# Patient Record
Sex: Female | Born: 1950 | Race: White | Hispanic: No | Smoking: Former smoker
Health system: Southern US, Community
[De-identification: ages and names within clinical notes are randomized; demographics above are authoritative.]

## PROBLEM LIST (undated history)

## (undated) DIAGNOSIS — K219 Gastro-esophageal reflux disease without esophagitis: Secondary | ICD-10-CM

## (undated) DIAGNOSIS — M1990 Unspecified osteoarthritis, unspecified site: Secondary | ICD-10-CM

## (undated) DIAGNOSIS — L989 Disorder of the skin and subcutaneous tissue, unspecified: Secondary | ICD-10-CM

## (undated) DIAGNOSIS — T7840XA Allergy, unspecified, initial encounter: Secondary | ICD-10-CM

## (undated) DIAGNOSIS — K2270 Barrett's esophagus without dysplasia: Secondary | ICD-10-CM

## (undated) DIAGNOSIS — J309 Allergic rhinitis, unspecified: Secondary | ICD-10-CM

## (undated) DIAGNOSIS — H269 Unspecified cataract: Secondary | ICD-10-CM

## (undated) DIAGNOSIS — D649 Anemia, unspecified: Secondary | ICD-10-CM

## (undated) DIAGNOSIS — R011 Cardiac murmur, unspecified: Secondary | ICD-10-CM

## (undated) DIAGNOSIS — M810 Age-related osteoporosis without current pathological fracture: Secondary | ICD-10-CM

## (undated) DIAGNOSIS — I10 Essential (primary) hypertension: Secondary | ICD-10-CM

## (undated) DIAGNOSIS — E785 Hyperlipidemia, unspecified: Secondary | ICD-10-CM

## (undated) DIAGNOSIS — I341 Nonrheumatic mitral (valve) prolapse: Secondary | ICD-10-CM

## (undated) DIAGNOSIS — F419 Anxiety disorder, unspecified: Secondary | ICD-10-CM

## (undated) HISTORY — DX: Nonrheumatic mitral (valve) prolapse: I34.1

## (undated) HISTORY — DX: Disorder of the skin and subcutaneous tissue, unspecified: L98.9

## (undated) HISTORY — DX: Anemia, unspecified: D64.9

## (undated) HISTORY — DX: Allergy, unspecified, initial encounter: T78.40XA

## (undated) HISTORY — DX: Hyperlipidemia, unspecified: E78.5

## (undated) HISTORY — DX: Cardiac murmur, unspecified: R01.1

## (undated) HISTORY — DX: Allergic rhinitis, unspecified: J30.9

## (undated) HISTORY — DX: Anxiety disorder, unspecified: F41.9

## (undated) HISTORY — DX: Unspecified osteoarthritis, unspecified site: M19.90

## (undated) HISTORY — DX: Gastro-esophageal reflux disease without esophagitis: K21.9

## (undated) HISTORY — DX: Age-related osteoporosis without current pathological fracture: M81.0

## (undated) HISTORY — DX: Unspecified cataract: H26.9

## (undated) HISTORY — DX: Barrett's esophagus without dysplasia: K22.70

## (undated) HISTORY — PX: COSMETIC SURGERY: SHX468

---

## 1973-04-19 HISTORY — PX: BARTHOLIN GLAND CYST EXCISION: SHX565

## 1973-04-19 HISTORY — PX: BREAST ENHANCEMENT SURGERY: SHX7

## 1985-04-19 HISTORY — PX: LAPAROSCOPIC OOPHERECTOMY: SHX6507

## 1985-04-19 HISTORY — PX: TUBAL LIGATION: SHX77

## 1987-04-20 HISTORY — PX: TRANSANAL EXCISION OF RECTAL MASS WITH HEMORRHOID INJECTION: SHX6135

## 2009-04-19 HISTORY — PX: LAPAROSCOPIC CHOLECYSTECTOMY: SUR755

## 2010-04-22 LAB — HM COLONOSCOPY

## 2014-10-31 LAB — HM MAMMOGRAPHY

## 2014-10-31 LAB — HM DEXA SCAN

## 2015-04-20 DIAGNOSIS — K2270 Barrett's esophagus without dysplasia: Secondary | ICD-10-CM

## 2015-04-20 HISTORY — DX: Barrett's esophagus without dysplasia: K22.70

## 2015-09-05 LAB — HM COLONOSCOPY

## 2016-06-13 ENCOUNTER — Emergency Department
Admission: EM | Admit: 2016-06-13 | Discharge: 2016-06-13 | Disposition: A | Discharge status: Home / Self Care | Payer: Medicare Other | Attending: Emergency Medicine | Admitting: Emergency Medicine

## 2016-06-13 DIAGNOSIS — R55 Syncope and collapse: Secondary | ICD-10-CM | POA: Insufficient documentation

## 2016-06-13 DIAGNOSIS — I10 Essential (primary) hypertension: Secondary | ICD-10-CM | POA: Insufficient documentation

## 2016-06-13 HISTORY — DX: Essential (primary) hypertension: I10

## 2016-06-13 LAB — GLUCOSE, CAPILLARY: Glucose-Capillary: 134 mg/dL — ABNORMAL HIGH (ref 65–99)

## 2016-06-13 NOTE — ED Triage Notes (Signed)
Pt escorted down from L&D , states she was with her son who just had a baby then moved to the other room after delivery the pt became hot and sat down at that time had LOC.. Pt states she did not eat this morning and is A/Ox3..Marland Kitchen

## 2016-06-13 NOTE — ED Provider Notes (Signed)
[facility] Emergency Department Provider Note  ____________________________________________   First MD Initiated Contact with Patient 06/13/16 [phone]     (approximate)  I have reviewed the triage vital signs and the nursing notes.   HISTORY  Chief Complaint Loss of Consciousness   HPI Kara French is a 66 y.o. female with a history of hypertension who is presenting after a near-syncopal episode. She says that she was up in labor and delivery witnessing the birth of her first grandchild. She says that she had just returned from [place] yesterday. She said that she had only gotten about 2 hours of sleep. Had not eaten anything yesterday and just had a doughnut this morning. Said that she had not been drinking as she normally would as well. Says that she also has IBS and has been having diarrhea because this is what happens to her when she "gets excited or anxious about something." She said that she had been standing up in a hot room upstairs in labor and delivery. She said that she began to feel lightheaded and nauseous and had several episodes of dry heaving. She was able to be moved to a chair where she said that she nearly lost consciousness. She said that there was a graying out of her vision that felt like things were closing in on her. She denies any palpitations, chest pain or shortness of breath. She says that after the incident she is now feeling much improved after being in a cool emergency room room. She denies any history of heart attack. No history of stroke. Denies any focal weakness or numbness. Son denies any seizure activity during the incident and said she was nearly unconscious for about 5 seconds.   Past Medical History:  Diagnosis Date  . Hypertension     There are no active problems to display for this patient.   History reviewed. No pertinent surgical history.  Prior to Admission medications   Not on File    Allergies Contrast media  [iodinated diagnostic agents]  No family history on file.  Social History Social History  Substance Use Topics  . Smoking status: Never Smoker  . Smokeless tobacco: Never Used  . Alcohol use No    Review of Systems Constitutional: No fever/chills Eyes: No visual changes. ENT: No sore throat. Cardiovascular: Denies chest pain. Respiratory: Denies shortness of breath. Gastrointestinal: No abdominal pain. no vomiting.  No diarrhea.  No constipation. Genitourinary: Negative for dysuria. Musculoskeletal: Negative for back pain. Skin: Negative for rash. Neurological: Negative for headaches, focal weakness or numbness.  10-point ROS otherwise negative.  ____________________________________________   PHYSICAL EXAM:  VITAL SIGNS: ED Triage Vitals  Enc Vitals Group     BP 06/13/16 0826 139/64     Pulse Rate 06/13/16 0826 73     Resp 06/13/16 0826 16     Temp 06/13/16 0826 97.8 F (36.6 C)     Temp Source 06/13/16 0826 Oral     SpO2 06/13/16 0826 98 %     Weight 06/13/16 0822 175 lb (79.4 kg)     Height 06/13/16 0822 5\' 8"  (1.727 m)     Head Circumference --      Peak Flow --      Pain Score --      Pain Loc --      Pain Edu? --      Excl. in GC? --     Constitutional: Alert and oriented. Well appearing and in no acute distress. Eyes:  Conjunctivae are normal. PERRL. EOMI. Head: Atraumatic. Nose: No congestion/rhinnorhea. Mouth/Throat: Mucous membranes are moist.   Neck: No stridor.   Cardiovascular: Normal rate, regular rhythm. Grossly normal heart sounds.  Respiratory: Normal respiratory effort.  No retractions. Lungs CTAB. Gastrointestinal: Soft and nontender. No distention. Musculoskeletal: No lower extremity tenderness nor edema.  No joint effusions. Neurologic:  Normal speech and language. No gross focal neurologic deficits are appreciated.  Skin:  Skin is warm, dry and intact. No rash noted. Psychiatric: Mood and affect are normal. Speech and behavior are  normal.  ____________________________________________   LABS (all labs ordered are listed, but only abnormal results are displayed)  Labs Reviewed  GLUCOSE, CAPILLARY - Abnormal; Notable for the following:       Result Value   Glucose-Capillary 134 (*)    All other components within normal limits   ____________________________________________  EKG  ED ECG REPORT I, Schaevitz,  Teena Iraniavid M, the attending physician, personally viewed and interpreted this ECG.   Date: 06/13/2016  EKG Time: 0833  Rate: 68  Rhythm: normal sinus rhythm  Axis: Normal axis  Intervals:none  ST&T Change: No ST segment elevation or depression. No abnormal T-wave inversion.  ____________________________________________  RADIOLOGY   ____________________________________________   PROCEDURES  Procedure(s) performed:   Procedures  Critical Care performed:   ____________________________________________   INITIAL IMPRESSION / ASSESSMENT AND PLAN / ED COURSE  Pertinent labs & imaging results that were available during my care of the patient were reviewed by me and considered in my medical decision making (see chart for details).  Patient with multiple factors contributing to what appears to be vasovagal syncope. Reassuring EKG and very reassuring story for this condition. I do not see in [place] to work her up for ACS at this time. Very unlikely that this would've been a pathologic arrhythmia causing her near syncopal episode. I advised her to get rest today as well as he and drink normally. She is understanding of this plan and willing to comply. Will be discharged.      ____________________________________________   FINAL CLINICAL IMPRESSION(S) / ED DIAGNOSES  Near-syncope.    NEW MEDICATIONS STARTED DURING THIS VISIT:  New Prescriptions   No medications on file     Note:  This document was prepared using Dragon voice recognition software and may include unintentional dictation  errors.    Myrna Blazeravid Matthew Schaevitz, MD 06/13/16 [phone]

## 2016-07-21 LAB — CBC AND DIFFERENTIAL
HCT: 38 (ref 36–46)
HEMOGLOBIN: 12.7 (ref 12.0–16.0)
NEUTROS ABS: 3
WBC: 5.2

## 2016-07-21 LAB — BASIC METABOLIC PANEL
BUN: 17 (ref 4–21)
Creatinine: 0.8 (ref 0.5–1.1)
Glucose: 100
POTASSIUM: 3.7 (ref 3.4–5.3)
Sodium: 141 (ref 137–147)

## 2016-07-21 LAB — LIPID PANEL
CHOLESTEROL: 179 (ref 0–200)
HDL: 44 (ref 35–70)
LDL Cholesterol: 116
TRIGLYCERIDES: 97 (ref 40–160)

## 2016-07-21 LAB — HEPATIC FUNCTION PANEL
ALT: 15 (ref 7–35)
AST: 15 (ref 13–35)
Alkaline Phosphatase: 52 (ref 25–125)

## 2016-07-21 LAB — TSH: TSH: 0.95 (ref 0.41–5.90)

## 2016-07-21 LAB — HEMOGLOBIN A1C: Hemoglobin A1C: 5.3

## 2017-01-13 ENCOUNTER — Ambulatory Visit (INDEPENDENT_AMBULATORY_CARE_PROVIDER_SITE_OTHER): Payer: Medicare Other | Admitting: Family Medicine

## 2017-01-13 ENCOUNTER — Encounter: Payer: Self-pay | Admitting: Family Medicine

## 2017-01-13 VITALS — BP 154/82 | HR 68 | Temp 97.6°F | Resp 16 | Ht 67.0 in | Wt 187.0 lb

## 2017-01-13 DIAGNOSIS — Z23 Encounter for immunization: Secondary | ICD-10-CM | POA: Diagnosis not present

## 2017-01-13 DIAGNOSIS — I10 Essential (primary) hypertension: Secondary | ICD-10-CM | POA: Diagnosis not present

## 2017-01-13 DIAGNOSIS — I341 Nonrheumatic mitral (valve) prolapse: Secondary | ICD-10-CM | POA: Insufficient documentation

## 2017-01-13 DIAGNOSIS — K2100 Gastro-esophageal reflux disease with esophagitis, without bleeding: Secondary | ICD-10-CM

## 2017-01-13 DIAGNOSIS — Z1283 Encounter for screening for malignant neoplasm of skin: Secondary | ICD-10-CM

## 2017-01-13 DIAGNOSIS — J3089 Other allergic rhinitis: Secondary | ICD-10-CM

## 2017-01-13 DIAGNOSIS — E785 Hyperlipidemia, unspecified: Secondary | ICD-10-CM | POA: Insufficient documentation

## 2017-01-13 DIAGNOSIS — K219 Gastro-esophageal reflux disease without esophagitis: Secondary | ICD-10-CM | POA: Insufficient documentation

## 2017-01-13 DIAGNOSIS — Z1159 Encounter for screening for other viral diseases: Secondary | ICD-10-CM

## 2017-01-13 DIAGNOSIS — J309 Allergic rhinitis, unspecified: Secondary | ICD-10-CM | POA: Insufficient documentation

## 2017-01-13 DIAGNOSIS — Z7689 Persons encountering health services in other specified circumstances: Secondary | ICD-10-CM

## 2017-01-13 DIAGNOSIS — K22719 Barrett's esophagus with dysplasia, unspecified: Secondary | ICD-10-CM

## 2017-01-13 NOTE — Patient Instructions (Signed)

## 2017-01-13 NOTE — Assessment & Plan Note (Addendum)
Patient brings recent lipid panel Continue pravastatin 10 mg daily Continue baby aspirin Repeat lipid panel at CPE in 6 months

## 2017-01-13 NOTE — Progress Notes (Signed)
Patient: Kara French Female    DOB: 1951/03/06   66 y.o.   MRN: [id number] Visit Date: 01/13/2017  Today's Provider: Shirlee Latch, MD   Chief Complaint  Patient presents with  . Establish Care   Subjective:    HPI   Kara French presents to establish care. She recently moved to the area from [place], [place]. She moved to be closer to her son, who is expecting his first child. She has a H/O HTN, mitral valve prolapse, hyperlipidemia, GERD, and seasonal allergies. She states she has white coat syndrome. She reports she is UTD on PNA vaccines, and is requesting a flu vaccine today.  Sees dermatologist annually for skin check - lost her husband in his 93s to melonoma. Would like referral. No personal or family h/o skin cancer  Sees cardiologist annually for mitral valve prolapse. Denies CP, SOB, DOE, LE edema.  HTN: taking Valsartan-HCTZ, Toprol daily.  Usually well controlled at home.  Does elevate in offices.  No medication side effects.  HLD: Taking pravastatin and ASA  daily.  No medication side effects.  Barretts esophagus, GERD: last EGD in 09/2015, recommended repeat in 3 years.  Taking Protonix  daily.  Trying to avoid acidic food to help with symptoms  Allergic rhinitis: Taking Zyrtec daily, seems to help, worse in the fall and spring.    H/o leukopenia: Had full work up from hematologist.  No cause found.  H/o hematuria: full workup by urologist.  Allergies  Allergen Reactions  . Neomycin-Bacitracin Zn-Polymyx   . Tape   . Contrast Media [Iodinated Diagnostic Agents] Rash  . Decongestant [Pseudoephedrine Hcl Er] Itching     Current Outpatient Prescriptions:  .  aspirin 81 MG chewable tablet, Chew 1 tablet by mouth daily., Disp: , Rfl:  .  cetirizine (ZYRTEC) 10 MG tablet, Take 10 mg by mouth daily., Disp: , Rfl:  .  Olopatadine HCl (PATADAY) 0.2 % SOLN, Apply to eye., Disp: , Rfl:  .  pantoprazole (PROTONIX) 40 MG tablet, Take 1 tablet by  mouth daily., Disp: , Rfl:  .  pravastatin (PRAVACHOL) 10 MG tablet, Take 1 tablet by mouth daily., Disp: , Rfl:  .  TOPROL XL 100 MG 24 hr tablet, Take 1 tablet by mouth daily., Disp: , Rfl:  .  valsartan-hydrochlorothiazide (DIOVAN-HCT) 320-25 MG tablet, Take 1 tablet by mouth daily., Disp: , Rfl:   Review of Systems  Constitutional: Negative.   HENT: Positive for postnasal drip, rhinorrhea and sneezing. Negative for congestion, dental problem, drooling, ear discharge, ear pain, facial swelling, hearing loss, mouth sores, nosebleeds, sinus pain, sinus pressure, sore throat, tinnitus, trouble swallowing and voice change.   Eyes: Positive for photophobia, pain, discharge, redness and itching. Negative for visual disturbance.  Respiratory: Negative.   Cardiovascular: Negative.   Gastrointestinal: Positive for abdominal distention. Negative for abdominal pain, anal bleeding, blood in stool, constipation, diarrhea, nausea, rectal pain and vomiting.  Endocrine: Negative.   Genitourinary: Negative.   Musculoskeletal: Negative.   Skin: Negative.   Allergic/Immunologic: Positive for environmental allergies. Negative for food allergies and immunocompromised state.  Neurological: Negative.   Hematological: Negative.   Psychiatric/Behavioral: Negative.    Past Medical History:  Diagnosis Date  . Allergic rhinitis   . Barrett's esophagus 2017   biopsy confirmed, related to GERD  . GERD (gastroesophageal reflux disease)   . Hyperlipidemia   . Hypertension   . Mitral valve prolapse    followed by cardiology   Past Surgical  History:  Procedure Laterality Date  . BARTHOLIN GLAND CYST EXCISION  1975  . BREAST ENHANCEMENT SURGERY Bilateral 1975  . LAPAROSCOPIC CHOLECYSTECTOMY  2011  . LAPAROSCOPIC OOPHERECTOMY Right 1987   2/2 benign tumor  . TRANSANAL EXCISION OF RECTAL MASS WITH HEMORRHOID INJECTION  1989   hemorrhoid removal  . TUBAL LIGATION  1987   Family History  Problem Relation Age  of Onset  . Dementia Mother 30  . Heart disease Mother 70       s/p 7 stents and CABG x3  . Colon cancer Sister 84  . Heart disease Brother        s./p stent placement  . Heart disease Brother        s/p stent placement  . Congestive Heart Failure Maternal Grandmother 82  . Heart attack Maternal Grandfather 66  . Healthy Paternal Grandmother   . Breast cancer Paternal Aunt 42    Social History  Substance Use Topics  . Smoking status: Former Smoker    Packs/day: 0.50    Years: 15.00    Types: Cigarettes    Quit date: 04/18/1997  . Smokeless tobacco: Never Used  . Alcohol use No   Objective:   BP (!) 154/82 (BP Location: Left Arm, Patient Position: Sitting, Cuff Size: Large) Comment: white coat syndrome  Pulse 68   Temp 97.6 F (36.4 C) (Oral)   Resp 16   Ht  (1.702 m)   Wt 187 lb (84.8 kg)   BMI 29.29 kg/m  Vitals:   01/13/17 1415  BP: (!) 154/82  Pulse: 68  Resp: 16  Temp: 97.6 F (36.4 C)  TempSrc: Oral  Weight: 187 lb (84.8 kg)  Height:  (1.702 m)     Physical Exam  Constitutional: She is oriented to person, place, and time. She appears well-developed and well-nourished. No distress.  HENT:  Head: Normocephalic and atraumatic.  Right Ear: External ear normal.  Left Ear: External ear normal.  Nose: Nose normal.  Mouth/Throat: Oropharynx is clear and moist.  Eyes: Pupils are equal, round, and reactive to light. Conjunctivae are normal. No scleral icterus.  Neck: Neck supple. No thyromegaly present.  Cardiovascular: Normal rate, regular rhythm, normal heart sounds and intact distal pulses.   No murmur heard. Pulmonary/Chest: Effort normal and breath sounds normal. No respiratory distress. She has no wheezes. She has no rales.  Abdominal: Soft. Bowel sounds are normal. She exhibits no distension. There is no tenderness. There is no rebound and no guarding.  Musculoskeletal: She exhibits no edema or deformity.  Lymphadenopathy:    She has no  cervical adenopathy.  Neurological: She is alert and oriented to person, place, and time.  Skin: Skin is warm and dry. No rash noted.  Psychiatric: She has a normal mood and affect. Her behavior is normal.  Vitals reviewed.      Assessment & Plan:      Problem List Items Addressed This Visit      Cardiovascular and Mediastinum   Hypertension    Uncontrolled today, patient reports is well controlled at home and she has white coat syndrome Continue valsartan HCTZ and Toprol-XL Recheck BMP Follow-up in 6 months      Relevant Medications   aspirin 81 MG chewable tablet   TOPROL XL 100 MG 24 hr tablet   pravastatin (PRAVACHOL) 10 MG tablet   valsartan-hydrochlorothiazide (DIOVAN-HCT) 320-25 MG tablet   Other Relevant Orders   BASIC METABOLIC PANEL WITH GFR   Mitral valve  prolapse    Will need cardiology referral at next CPE No murmur audible while sitting today      Relevant Medications   aspirin 81 MG chewable tablet   TOPROL XL 100 MG 24 hr tablet   pravastatin (PRAVACHOL) 10 MG tablet   valsartan-hydrochlorothiazide (DIOVAN-HCT) 320-25 MG tablet     Respiratory   Allergic rhinitis    Well-controlled Continue Zyrtec, Pataday eyedrops        Digestive   Barrett's esophagus    Biopsy confirmed on EGD in 2017 per records that the patient has brought with her Continue PPI Will need repeat GI referral for EGD in 2020      GERD (gastroesophageal reflux disease)    Fair control, but diet dependent Patient is going to try to eat more bland foods Continue PPI Given Barrett's esophagus, she will need repeat EGD in 2020      Relevant Medications   pantoprazole (PROTONIX) 40 MG tablet     Other   Hyperlipidemia    Patient brings recent lipid panel Continue pravastatin 10 mg daily Continue baby aspirin Repeat lipid panel at CPE in 6 months      Relevant Medications   aspirin 81 MG chewable tablet   TOPROL XL 100 MG 24 hr tablet   pravastatin (PRAVACHOL) 10 MG  tablet   valsartan-hydrochlorothiazide (DIOVAN-HCT) 320-25 MG tablet    Other Visit Diagnoses    Encounter to establish care    -  Primary   Screening exam for skin cancer       Relevant Orders   Ambulatory referral to Dermatology   Need for hepatitis C screening test       Relevant Orders   Hepatitis C Antibody   Encounter for immunization       Relevant Orders   Flu vaccine HIGH DOSE PF (Completed)     We'll request records from PCP     The entirety of the information documented in the History of Present Illness, Review of Systems and Physical Exam were personally obtained by me. Portions of this information were initially documented by Irving Burton Ratchford, CMA and reviewed by me for thoroughness and accuracy.    Shirlee Latch, MD  [facility]

## 2017-01-13 NOTE — Assessment & Plan Note (Signed)
Biopsy confirmed on EGD in 2017 per records that the patient has brought with her Continue PPI Will need repeat GI referral for EGD in 2020

## 2017-01-13 NOTE — Assessment & Plan Note (Signed)
Well-controlled Continue Zyrtec, Pataday eyedrops

## 2017-01-13 NOTE — Assessment & Plan Note (Signed)
Fair control, but diet dependent Patient is going to try to eat more bland foods Continue PPI Given Barrett's esophagus, she will need repeat EGD in 2020

## 2017-01-13 NOTE — Assessment & Plan Note (Signed)
Will need cardiology referral at next CPE No murmur audible while sitting today

## 2017-01-13 NOTE — Assessment & Plan Note (Signed)
Uncontrolled today, patient reports is well controlled at home and she has white coat syndrome Continue valsartan HCTZ and Toprol-XL Recheck BMP Follow-up in 6 months

## 2017-01-14 ENCOUNTER — Encounter: Payer: Self-pay | Admitting: Family Medicine

## 2017-01-14 ENCOUNTER — Telehealth: Payer: Self-pay

## 2017-01-14 LAB — BASIC METABOLIC PANEL WITH GFR
BUN: 14 mg/dL (ref 7–25)
CO2: 29 mmol/L (ref 20–32)
CREATININE: 0.79 mg/dL (ref 0.50–0.99)
Calcium: 9.3 mg/dL (ref 8.6–10.4)
Chloride: 102 mmol/L (ref 98–110)
GFR, EST AFRICAN AMERICAN: 89 mL/min/{1.73_m2} (ref >=60)
GFR, EST NON AFRICAN AMERICAN: 77 mL/min/{1.73_m2} (ref >=60)
Glucose, Bld: 87 mg/dL (ref 65–99)
POTASSIUM: 3.7 mmol/L (ref 3.5–5.3)
SODIUM: 141 mmol/L (ref 135–146)

## 2017-01-14 LAB — HEPATITIS C ANTIBODY
Hepatitis C Ab: NONREACTIVE
SIGNAL TO CUT-OFF: 0.01 (ref <1.00)

## 2017-01-14 NOTE — Telephone Encounter (Signed)
-----   Message from Erasmo Downer, MD sent at 01/14/2017 12:22 PM EDT ----- Normal kidney function and electrolytes. Negative hepatitis C screening.  Erasmo Downer, MD, MPH [facility] 01/14/2017 12:21 PM

## 2017-01-14 NOTE — Telephone Encounter (Signed)
Pt advised.

## 2017-01-17 ENCOUNTER — Encounter: Payer: Self-pay | Admitting: Family Medicine

## 2017-02-10 ENCOUNTER — Ambulatory Visit: Payer: Self-pay | Admitting: Family Medicine

## 2017-04-13 ENCOUNTER — Ambulatory Visit (INDEPENDENT_AMBULATORY_CARE_PROVIDER_SITE_OTHER): Payer: Medicare Other | Admitting: Physician Assistant

## 2017-04-13 ENCOUNTER — Encounter: Payer: Self-pay | Admitting: Physician Assistant

## 2017-04-13 VITALS — BP 144/80 | HR 64 | Temp 98.2°F | Resp 16 | Wt 188.0 lb

## 2017-04-13 DIAGNOSIS — R300 Dysuria: Secondary | ICD-10-CM | POA: Diagnosis not present

## 2017-04-13 DIAGNOSIS — I10 Essential (primary) hypertension: Secondary | ICD-10-CM

## 2017-04-13 DIAGNOSIS — N3090 Cystitis, unspecified without hematuria: Secondary | ICD-10-CM | POA: Diagnosis not present

## 2017-04-13 MED ORDER — SULFAMETHOXAZOLE-TRIMETHOPRIM 800-160 MG PO TABS: 1.0000 | ORAL_TABLET | Freq: Two times a day (BID) | ORAL | 0 refills | Status: AC

## 2017-04-13 NOTE — Patient Instructions (Signed)

## 2017-04-13 NOTE — Progress Notes (Signed)
[facility]  Chief Complaint  Patient presents with  . Urinary Tract Infection    Started two days ago.    Subjective:    Patient ID: Kara HampshireWilma Dean Ytuarte, female    DOB: 09-Nov-1950, 66 y.o.   MRN: [id number]   Urinary Tract Infection: Patient complains of frequency, nausea and urgency She has had symptoms for 3 days. Patient also complains of Lower abdominal pain. Patient denies back pain and vaginal discharge. Denies N/V. Patient does have a history of recurrent UTI.  Patient does not have a history of pyelonephritis or other renal issues. Patient denies vaginal discharge and denies new sexual partners. The patient denies recent travel outside of the [place]. She has used AZO tablets with moderate relief. She has had Bactrim for a UTI before with good tolerance and resolution.  She is also asking about her BP medication in regards to the valsartan recall. She is wondering if it should be changed.  Review of Systems  Constitutional: Positive for malaise/fatigue. Negative for chills, diaphoresis, fever and weight loss.  Gastrointestinal: Positive for nausea. Negative for abdominal pain, blood in stool, constipation, diarrhea, heartburn, melena and vomiting.  Genitourinary: Positive for dysuria, frequency and urgency. Negative for flank pain and hematuria.  Neurological: Negative for dizziness, weakness and headaches.       Objective:   BP (!) 144/80 (BP Location: Right Arm, Patient Position: Sitting, Cuff Size: Normal)   Pulse 64   Temp 98.2 F (36.8 C) (Oral)   Resp 16   Wt 188 lb (85.3 kg)   BMI 29.44 kg/m   Patient Active Problem List   Diagnosis Date Noted  . Hypertension   . Hyperlipidemia   . Allergic rhinitis   . GERD (gastroesophageal reflux disease)   . Mitral valve prolapse   . Barrett's esophagus 04/20/2015    Outpatient Encounter Medications as of 04/13/2017  Medication Sig  . aspirin 81 MG chewable tablet Chew 1  tablet by mouth daily.  . cetirizine (ZYRTEC) 10 MG tablet Take 10 mg by mouth daily.  . Olopatadine HCl (PATADAY) 0.2 % SOLN Apply to eye.  . pantoprazole (PROTONIX) 40 MG tablet Take 1 tablet by mouth daily.  . pravastatin (PRAVACHOL) 10 MG tablet Take 1 tablet by mouth daily.  . TOPROL XL 100 MG 24 hr tablet Take 1 tablet by mouth daily.  . valsartan-hydrochlorothiazide (DIOVAN-HCT) 320-25 MG tablet Take 1 tablet by mouth daily.   No facility-administered encounter medications on file as of 04/13/2017.     Allergies  Allergen Reactions  . Neomycin-Bacitracin Zn-Polymyx   . Tape   . Contrast Media [Iodinated Diagnostic Agents] Rash  . Decongestant [Pseudoephedrine Hcl Er] Itching       Physical Exam  Constitutional: She is oriented to person, place, and time. She appears well-developed and well-nourished. No distress.  Cardiovascular: Normal rate and regular rhythm.  Pulmonary/Chest: Effort normal and breath sounds normal.  Abdominal: Soft. Bowel sounds are normal. She exhibits no distension. There is tenderness in the suprapubic area. There is no rebound, no guarding and no CVA tenderness.  Neurological: She is alert and oriented to person, place, and time.  Skin: Skin is warm and dry. She is not diaphoretic.  Psychiatric: She has a normal mood and affect. Her behavior is normal.       Assessment & Plan:  1. Cystitis  Dipstick positive for some blood and leukocytes. Will cx and begin empiric treatment with bactrim.  - POCT urinalysis  dipstick - CULTURE, URINE COMPREHENSIVE  2. Dysuria  - sulfamethoxazole-trimethoprim (BACTRIM DS) 800-160 MG tablet; Take 1 tablet by mouth 2 (two) times daily for 3 days.  Dispense: 6 tablet; Refill: 0 - CULTURE, URINE COMPREHENSIVE  3. Essential Hypertension  She is currently on Valsartan-HCTZ. I will defer to Dr. B for them to decide on a mutually acceptable alternative if necessary.  Return if symptoms worsen or fail to  improve.  The entirety of the information documented in the History of Present Illness, Review of Systems and Physical Exam were personally obtained by me. Portions of this information were initially documented by Kavin LeechLaura Walsh, CMA and reviewed by me for thoroughness and accuracy.

## 2017-04-14 ENCOUNTER — Telehealth: Payer: Self-pay | Admitting: Family Medicine

## 2017-04-14 MED ORDER — LOSARTAN POTASSIUM-HCTZ 100-25 MG PO TABS: 1.0000 | ORAL_TABLET | Freq: Every day | ORAL | 3 refills | Status: DC

## 2017-04-14 NOTE — Telephone Encounter (Signed)
Left message advising pt. 

## 2017-04-14 NOTE — Telephone Encounter (Signed)
Patient asked Ricki Rodriguezdriana in recent visit about Valsartan recall.  Seems she was not notified by pharmacy that her brand of Valsartan was recalled, but she did recently move.  We can go ahead and switch to losartan to be on the safe side.  Will Rx losartan-hctz 100-25mg  daily.  Stop valsartan-hctz.  Please let patient know.  We will f/u on BP at next appt.  Erasmo DownerBacigalupo, Tameisha Covell M, MD, MPH [facility] 04/14/2017 2:44 PM

## 2017-04-15 LAB — POCT URINALYSIS DIPSTICK
Bilirubin, UA: NEGATIVE
Glucose, UA: NEGATIVE
Ketones, UA: NEGATIVE
Leukocytes, UA: NEGATIVE
Nitrite, UA: NEGATIVE
Protein, UA: NEGATIVE
Spec Grav, UA: 1.02 (ref 1.010–1.025)
Urobilinogen, UA: 0.2 E.U./dL
pH, UA: 5 (ref 5.0–8.0)

## 2017-04-15 LAB — CULTURE, URINE COMPREHENSIVE
MICRO NUMBER:: 81449425
SPECIMEN QUALITY:: ADEQUATE

## 2017-04-27 ENCOUNTER — Encounter: Payer: Self-pay | Admitting: Family Medicine

## 2017-04-27 ENCOUNTER — Ambulatory Visit (INDEPENDENT_AMBULATORY_CARE_PROVIDER_SITE_OTHER): Payer: Medicare Other | Admitting: Family Medicine

## 2017-04-27 VITALS — BP 142/84 | HR 64 | Temp 97.9°F | Resp 16 | Wt 193.0 lb

## 2017-04-27 DIAGNOSIS — M62838 Other muscle spasm: Secondary | ICD-10-CM | POA: Diagnosis not present

## 2017-04-27 DIAGNOSIS — S39012A Strain of muscle, fascia and tendon of lower back, initial encounter: Secondary | ICD-10-CM | POA: Diagnosis not present

## 2017-04-27 MED ORDER — BACLOFEN 10 MG PO TABS: 10.0000 mg | ORAL_TABLET | Freq: Three times a day (TID) | ORAL | 0 refills | Status: DC

## 2017-04-27 MED ORDER — CYCLOBENZAPRINE HCL 5 MG PO TABS: 5.0000 mg | ORAL_TABLET | Freq: Three times a day (TID) | ORAL | 1 refills | Status: DC | PRN

## 2017-04-27 NOTE — Progress Notes (Addendum)
Patient: Kara HampshireWilma Dean Anger Female    DOB: 04-23-1950   67 y.o.   MRN: [id number] Visit Date: 04/27/2017  Today's Provider: Shirlee LatchAngela Clorinda Wyble, MD   I, Joslyn HyEmily Ratchford, CMA, am acting as scribe for Shirlee LatchAngela Cresencia Asmus, MD.  Chief Complaint  Patient presents with  . Back Pain   Subjective:    Back Pain  This is a new problem. Episode onset: x 1 week. The problem occurs constantly. The problem has been gradually worsening since onset. The pain is present in the gluteal (left foot). Quality: sharp. The pain does not radiate. The pain is at a severity of 9/10. The pain is severe. Worse during: worse when she wakes up in the mornings. Exacerbated by: pt states she has plantar fasciitis of her right foot, and has been compensating. Associated symptoms include leg pain (occasional; not sciatica per pt). Pertinent negatives include no abdominal pain, bladder incontinence, bowel incontinence, chest pain, dysuria, fever, headaches, numbness, pelvic pain, tingling, weakness or weight loss. Treatments tried: Aleve, Therma Care heating patch, Tens Unit. The treatment provided no relief.   Worse after keeping grandchild that she lifted frequently over the weekend.  Really painful and stiff in the mornings.Heat and aleve seem to help but wear off.    Allergies  Allergen Reactions  . Neomycin-Bacitracin Zn-Polymyx   . Tape   . Contrast Media [Iodinated Diagnostic Agents] Rash  . Decongestant [Pseudoephedrine Hcl Er] Itching     Current Outpatient Medications:  .  aspirin 81 MG chewable tablet, Chew 1 tablet by mouth daily., Disp: , Rfl:  .  cetirizine (ZYRTEC) 10 MG tablet, Take 10 mg by mouth daily., Disp: , Rfl:  .  losartan-hydrochlorothiazide (HYZAAR) 100-25 MG tablet, Take 1 tablet by mouth daily., Disp: 90 tablet, Rfl: 3 .  pantoprazole (PROTONIX) 40 MG tablet, Take 1 tablet by mouth daily., Disp: , Rfl:  .  pravastatin (PRAVACHOL) 10 MG tablet, Take 1 tablet by mouth daily., Disp: , Rfl:    .  TOPROL XL 100 MG 24 hr tablet, Take 1 tablet by mouth daily., Disp: , Rfl:  .  baclofen (LIORESAL) 10 MG tablet, Take 1 tablet (10 mg total) by mouth 3 (three) times daily., Disp: 90 each, Rfl: 0  Review of Systems  Constitutional: Negative for fever and weight loss.  Cardiovascular: Negative for chest pain.  Gastrointestinal: Negative for abdominal pain and bowel incontinence.  Genitourinary: Negative for bladder incontinence, dysuria and pelvic pain.  Musculoskeletal: Positive for back pain.  Neurological: Negative for tingling, weakness, numbness and headaches.    Social History   Tobacco Use  . Smoking status: Former Smoker    Packs/day: 0.50    Years: 15.00    Pack years: 7.50    Types: Cigarettes    Last attempt to quit: 04/18/1997    Years since quitting: 20.0  . Smokeless tobacco: Never Used  Substance Use Topics  . Alcohol use: No   Objective:   BP (!) 142/84 (BP Location: Left Arm, Patient Position: Sitting, Cuff Size: Normal)   Pulse 64   Temp 97.9 F (36.6 C) (Oral)   Resp 16   Wt 193 lb (87.5 kg)   SpO2 98%   BMI 30.23 kg/m  Vitals:   04/27/17 1452  BP: (!) 142/84  Pulse: 64  Resp: 16  Temp: 97.9 F (36.6 C)  TempSrc: Oral  SpO2: 98%  Weight: 193 lb (87.5 kg)     Physical Exam  Constitutional: She is  oriented to person, place, and time. She appears well-developed and well-nourished. No distress.  HENT:  Head: Normocephalic and atraumatic.  Cardiovascular: Normal rate, regular rhythm, normal heart sounds and intact distal pulses.  No murmur heard. Pulmonary/Chest: Effort normal and breath sounds normal. No respiratory distress. She has no wheezes. She has no rales.  Musculoskeletal: She exhibits no edema.  Back: No midline TTP. SI joints stable with no TTP. Muscle spasm of L paraspinal muscles of lumbar spine. Negative SLR b/l.  Strength intact in LEs.  Sensation to light touch intact in b/l LEs  Neurological: She is alert and oriented to  person, place, and time.  Skin: Skin is warm and dry. No rash noted.  Psychiatric: She has a normal mood and affect. Her behavior is normal.  Vitals reviewed.       Assessment & Plan:     1. Strain of lumbar paraspinous muscle, initial encounter 2. Muscle spasm - exam consistent with lumbar paraspinal muscle strain and spasm - no red flag symptoms - continue antiinflammatories (avoid excess NSAIDs due to GERD and Barretts esophagus), heat, TENs unit - can add prn tylenol to help as well - trial of muscle relaxer to help with muscle spasm (flexeril needed PA, baclofen sent instead) - discussed natural course and to expect 4-6 week recovery period - back exercises to do to prevent future injury given to start after feeling better - return precautions discussed   Meds ordered this encounter  Medications  . DISCONTD: cyclobenzaprine (FLEXERIL) 5 MG tablet    Sig: Take 1 tablet (5 mg total) by mouth 3 (three) times daily as needed for muscle spasms.    Dispense:  30 tablet    Refill:  1  . baclofen (LIORESAL) 10 MG tablet    Sig: Take 1 tablet (10 mg total) by mouth 3 (three) times daily.    Dispense:  90 each    Refill:  0    Return if symptoms worsen or fail to improve.     The entirety of the information documented in the History of Present Illness, Review of Systems and Physical Exam were personally obtained by me. Portions of this information were initially documented by Irving Burton Ratchford, CMA and reviewed by me for thoroughness and accuracy.    Erasmo Downer, MD, MPH [facility] 04/27/2017 4:40 PM

## 2017-04-27 NOTE — Patient Instructions (Signed)

## 2017-04-27 NOTE — Addendum Note (Signed)
Addended by: Erasmo DownerBACIGALUPO, Deaunte Dente M on: 04/27/2017 04:40 PM   Modules accepted: Orders

## 2017-05-09 ENCOUNTER — Ambulatory Visit (INDEPENDENT_AMBULATORY_CARE_PROVIDER_SITE_OTHER): Payer: Medicare Other | Admitting: Family Medicine

## 2017-05-09 ENCOUNTER — Ambulatory Visit
Admission: RE | Admit: 2017-05-09 | Discharge: 2017-05-09 | Disposition: A | Discharge status: Home / Self Care | Payer: Medicare Other | Source: Ambulatory Visit | Attending: Family Medicine | Admitting: Family Medicine

## 2017-05-09 ENCOUNTER — Encounter: Payer: Self-pay | Admitting: Family Medicine

## 2017-05-09 ENCOUNTER — Telehealth: Payer: Self-pay

## 2017-05-09 VITALS — BP 150/84 | HR 73 | Temp 97.5°F | Resp 16 | Ht 67.0 in | Wt 191.0 lb

## 2017-05-09 DIAGNOSIS — R0602 Shortness of breath: Secondary | ICD-10-CM | POA: Diagnosis present

## 2017-05-09 DIAGNOSIS — R188 Other ascites: Secondary | ICD-10-CM

## 2017-05-09 DIAGNOSIS — R609 Edema, unspecified: Secondary | ICD-10-CM | POA: Diagnosis not present

## 2017-05-09 MED ORDER — FUROSEMIDE 20 MG PO TABS: 20.0000 mg | ORAL_TABLET | Freq: Every day | ORAL | 3 refills | Status: DC

## 2017-05-09 NOTE — Telephone Encounter (Signed)
Left message advising pt. OK per DPR. 

## 2017-05-09 NOTE — Patient Instructions (Signed)
Ascites °Ascites is a collection of excess fluid in the abdomen. Ascites can range from mild to severe. It can get worse without treatment. °What are the causes? °Possible causes include: °· Cirrhosis. This is the most common cause of ascites. °· Infection or inflammation in the abdomen. °· Cancer in the abdomen. °· Heart failure. °· Kidney disease. °· Inflammation of the pancreas. °· Clots in the veins of the liver. ° °What are the signs or symptoms? °Signs and symptoms may include: °· A feeling of fullness in your abdomen. This is common. °· An increase in the size of your abdomen or your waist. °· Swelling in your legs. °· Swelling of the scrotum in men. °· Difficulty breathing. °· Abdominal pain. °· Sudden weight gain. ° °If the condition is mild, you may not have symptoms. °How is this diagnosed? °To make a diagnosis, your health care provider will: °· Ask about your medical history. °· Perform a physical exam. °· Order imaging tests, such as an ultrasound or CT scan of your abdomen. ° °How is this treated? °Treatment depends on the cause of the ascites. It may include: °· Taking a pill to make you urinate. This is called a water pill (diuretic pill). °· Strictly reducing your salt (sodium) intake. Salt can cause extra fluid to be kept in the body, and this makes ascites worse. °· Having a procedure to remove fluid from your abdomen (paracentesis). °· Having a procedure to transfer fluid from your abdomen into a vein. °· Having a procedure that connects two of the major veins within your liver and relieves pressure on your liver (TIPS procedure). ° °Ascites may go away or improve with treatment of the condition that caused it. °Follow these instructions at home: °· Keep track of your weight. To do this, weigh yourself at the same time every day and record your weight. °· Keep track of how much you drink and any changes in the amount you urinate. °· Follow any instructions that your health care provider gives  you about how much to drink. °· Try not to eat salty (high-sodium) foods. °· Take medicines only as directed by your health care provider. °· Keep all follow-up visits as directed by your health care provider. This is important. °· Report any changes in your health to your health care provider, especially if you develop new symptoms or your symptoms get worse. °Contact a health care provider if: °· Your gain more than 3 pounds in 3 days. °· Your abdominal size or your waist size increases. °· You have new swelling in your legs. °· The swelling in your legs gets worse. °Get help right away if: °· You develop a fever. °· You develop confusion. °· You develop new or worsening difficulty breathing. °· You develop new or worsening abdominal pain. °· You develop new or worsening swelling in the scrotum (in men). °This information is not intended to replace advice given to you by your health care provider. Make sure you discuss any questions you have with your health care provider. °Document Released: 04/05/2005 Document Revised: 08/13/2015 Document Reviewed: 11/02/2013 °Elsevier Interactive Patient Education © 2018 [employer] ° °

## 2017-05-09 NOTE — Progress Notes (Addendum)
Patient: Kara French Female    DOB: December 15, 1950   67 y.o.   MRN: [id number] Visit Date: 05/10/2017  Today's Provider: Shirlee LatchAngela Elisabetta Mishra, MD   Chief Complaint  Patient presents with  . Edema   Subjective:    Patient was given Baclofen 04/27/2017 ans stopped 05/05/2017 due to swelling. Patient states that she continued to have swelling in feet, legs and abdomen. Patient also states after her blood pressure medications were changed her blood pressure has been elevated. Patient states swelling in feet and legs has gone down. However she still has swelling her abdomen.    Reports that she first noticed swelling around 05/03/17.  Abd swelling seems to be decreasing slightly.  Reports abd is diffusely unv=comfortable and TTP.  She reports bowel movements are normal and regular. She denies vomiting, hematochezia, melena. She reports intermittent nausea.  She is unsure if she has CHF. She was told the past to watch her weights daily and given information about CHF, but no one has ever told her that she was diagnosed with this. [facility] [place] [facility] [place] [place] (Dr [place]re [place]).  She has never had any liver or kidney issues. She denies any previous episodes of abdominal swelling or ascites.  She does not take Tylenol often She does not drink any alcohol.   Allergies  Allergen Reactions  . Neomycin-Bacitracin Zn-Polymyx   . Tape   . Contrast Media [Iodinated Diagnostic Agents] Rash  . Decongestant [Pseudoephedrine Hcl Er] Itching     Current Outpatient Medications:  .  aspirin 81 MG chewable tablet, Chew 1 tablet by mouth daily., Disp: , Rfl:  .  cetirizine (ZYRTEC) 10 MG tablet, Take 10 mg by mouth daily., Disp: , Rfl:  .  losartan-hydrochlorothiazide (HYZAAR) 100-25 MG tablet, Take 1 tablet by mouth daily., Disp: 90 tablet, Rfl: 3 .  pantoprazole (PROTONIX) 40 MG tablet, Take 1 tablet by mouth daily., Disp: , Rfl:  .  pravastatin (PRAVACHOL) 10 MG  tablet, Take 1 tablet by mouth daily., Disp: , Rfl:  .  TOPROL XL 100 MG 24 hr tablet, Take 1 tablet by mouth daily., Disp: , Rfl:  .  baclofen (LIORESAL) 10 MG tablet, Take 1 tablet (10 mg total) by mouth 3 (three) times daily. (Patient not taking: Reported on 05/09/2017), Disp: 90 each, Rfl: 0 .  furosemide (LASIX) 20 MG tablet, Take 1 tablet (20 mg total) by mouth daily., Disp: 30 tablet, Rfl: 3 .  potassium chloride SA (K-DUR,KLOR-CON) 20 MEQ tablet, Take 1 tablet (20 mEq total) by mouth daily., Disp: 30 tablet, Rfl: 3  Review of Systems  Constitutional: Negative for appetite change, chills, fatigue and fever.  Respiratory: Negative for chest tightness and shortness of breath.   Cardiovascular: Negative for chest pain and palpitations.  Gastrointestinal: Negative for abdominal pain, nausea and vomiting.  Neurological: Negative for dizziness and weakness.    Social History   Tobacco Use  . Smoking status: Former Smoker    Packs/day: 0.50    Years: 15.00    Pack years: 7.50    Types: Cigarettes    Last attempt to quit: 04/18/1997    Years since quitting: 20.0  . Smokeless tobacco: Never Used  Substance Use Topics  . Alcohol use: No   Objective:   BP (!) 150/84 (BP Location: Left Arm, Patient Position: Sitting, Cuff Size: Normal)   Pulse 73   Temp (!) 97.5 F (36.4 C) (Oral)   Resp 16  Ht 5\' 7"  (1.702 m)   Wt 191 lb (86.6 kg)   SpO2 99%   BMI 29.91 kg/m  Vitals:   05/09/17 1456  BP: (!) 150/84  Pulse: 73  Resp: 16  Temp: (!) 97.5 F (36.4 C)  TempSrc: Oral  SpO2: 99%  Weight: 191 lb (86.6 kg)  Height: 5\' 7"  (1.702 m)     Physical Exam  Constitutional: She appears well-developed and well-nourished. No distress.  HENT:  Head: Normocephalic and atraumatic.  Eyes: Conjunctivae are normal. No scleral icterus.  Neck: Neck supple.  Cardiovascular: Normal rate, regular rhythm, normal heart sounds and intact distal pulses.  Pulmonary/Chest: Effort normal. No  respiratory distress. She has no wheezes.  Crackles in bilateral bases  Abdominal: Soft. Bowel sounds are normal. She exhibits distension, fluid wave and ascites. There is hepatomegaly (but difficult to palpate due to ascites). There is tenderness (diffuse mild tenderness). There is no rebound and no guarding.  Musculoskeletal: She exhibits no edema or deformity.  Lymphadenopathy:    She has no cervical adenopathy.  Neurological: She is alert.  Skin: Skin is warm and dry. No rash noted.  Psychiatric: She has a normal mood and affect. Her behavior is normal.  Vitals reviewed.   EKG: Normal sinus rhythm, no ischemic changes    Assessment & Plan:   Problem List Items Addressed This Visit      Other   Other ascites - Primary    New-onset Recent liver function tests within normal limits Could be related to liver disease or new heart failure Check CMP, INR, platelets, BNP Check echo, abdominal ultrasound,chest x-ray Referral to gastroenterology and cardiology Return precautions discussed Start Lasix 20 mg daily Will call patient to discuss whether potassium supplementation is indicated after reviewing CMP Follow-up in 2 weeks and consider rechecking potassium levels If all testing is normal, will need to look for possible pelvic disease, such as ovarian pathology that could cause this      Relevant Orders   US Abdomen Complete   Comprehensive metabolic panel (Completed)   Ambulatory referral to Gastroenterology   Pro b natriuretic peptide (BNP) (Completed)   INR/PT (Completed)   CBC w/Diff/Platelet (Completed)   Shortness of breath    New-onset with crackles in bilateral bases and new ascites No peripheral edema today, but patient reports she did have a last week Unclear if she has a history of CHF or not Check chest x-ray, echo, BNP Check other labs and imaging as above for new ascites Referral to cardiology Return precautions discussed      Relevant Orders   DG Chest 2  View (Completed)   Ambulatory referral to Cardiology   Pro b natriuretic peptide (BNP) (Completed)   CBC w/Diff/Platelet (Completed)   ECHOCARDIOGRAM COMPLETE    Other Visit Diagnoses    Swelling       Relevant Orders   EKG 12-Lead (Completed)       Return in about 2 weeks (around 05/23/2017).   The entirety of the information documented in the History of Present Illness, Review of Systems and Physical Exam were personally obtained by me. Portions of this information were initially documented by Caren Macadam, CMA and reviewed by me for thoroughness and accuracy.    Erasmo Downer, MD, MPH [facility] 05/10/2017 8:20 AM

## 2017-05-09 NOTE — Assessment & Plan Note (Signed)
New-onset with crackles in bilateral bases and new ascites No peripheral edema today, but patient reports she did have a last week Unclear if she has a history of CHF or not Check chest x-ray, echo, BNP Check other labs and imaging as above for new ascites Referral to cardiology Return precautions discussed

## 2017-05-09 NOTE — Assessment & Plan Note (Addendum)
New-onset Recent liver function tests within normal limits Could be related to liver disease or new heart failure Check CMP, INR, platelets, BNP Check echo, abdominal ultrasound,chest x-ray Referral to gastroenterology and cardiology Return precautions discussed Start Lasix 20 mg daily Will call patient to discuss whether potassium supplementation is indicated after reviewing CMP Follow-up in 2 weeks and consider rechecking potassium levels If all testing is normal, will need to look for possible pelvic disease, such as ovarian pathology that could cause this

## 2017-05-09 NOTE — Telephone Encounter (Signed)
-----   Message from Erasmo DownerAngela M Bacigalupo, MD sent at 05/09/2017  4:41 PM EST ----- No acute heart or lung disease noted. No clear edema. Will review the rest of testing when it is available.  Erasmo DownerBacigalupo, Angela M, MD, MPH [facility] 05/09/2017 4:41 PM

## 2017-05-10 ENCOUNTER — Other Ambulatory Visit: Payer: Self-pay | Admitting: Family Medicine

## 2017-05-10 ENCOUNTER — Telehealth: Payer: Self-pay

## 2017-05-10 ENCOUNTER — Ambulatory Visit
Admission: RE | Admit: 2017-05-10 | Discharge: 2017-05-10 | Disposition: A | Discharge status: Home / Self Care | Payer: Medicare Other | Source: Ambulatory Visit | Attending: Family Medicine | Admitting: Family Medicine

## 2017-05-10 DIAGNOSIS — R0602 Shortness of breath: Secondary | ICD-10-CM | POA: Insufficient documentation

## 2017-05-10 DIAGNOSIS — I10 Essential (primary) hypertension: Secondary | ICD-10-CM | POA: Diagnosis not present

## 2017-05-10 DIAGNOSIS — I081 Rheumatic disorders of both mitral and tricuspid valves: Secondary | ICD-10-CM | POA: Diagnosis not present

## 2017-05-10 DIAGNOSIS — K219 Gastro-esophageal reflux disease without esophagitis: Secondary | ICD-10-CM | POA: Insufficient documentation

## 2017-05-10 DIAGNOSIS — K2270 Barrett's esophagus without dysplasia: Secondary | ICD-10-CM | POA: Diagnosis not present

## 2017-05-10 LAB — COMPREHENSIVE METABOLIC PANEL
ALBUMIN: 4.6 g/dL (ref 3.6–4.8)
ALT: 20 IU/L (ref 0–32)
AST: 18 IU/L (ref 0–40)
Albumin/Globulin Ratio: 2 (ref 1.2–2.2)
Alkaline Phosphatase: 62 IU/L (ref 39–117)
BUN / CREAT RATIO: 18 (ref 12–28)
BUN: 15 mg/dL (ref 8–27)
Bilirubin Total: 0.9 mg/dL (ref 0.0–1.2)
CALCIUM: 9.6 mg/dL (ref 8.7–10.3)
CO2: 25 mmol/L (ref 20–29)
CREATININE: 0.85 mg/dL (ref 0.57–1.00)
Chloride: 92 mmol/L — ABNORMAL LOW (ref 96–106)
GFR, EST AFRICAN AMERICAN: 81 mL/min/{1.73_m2} (ref >59)
GFR, EST NON AFRICAN AMERICAN: 71 mL/min/{1.73_m2} (ref >59)
GLUCOSE: 106 mg/dL — AB (ref 65–99)
Globulin, Total: 2.3 g/dL (ref 1.5–4.5)
Potassium: 3.3 mmol/L — ABNORMAL LOW (ref 3.5–5.2)
Sodium: 134 mmol/L (ref 134–144)
TOTAL PROTEIN: 6.9 g/dL (ref 6.0–8.5)

## 2017-05-10 LAB — CBC WITH DIFFERENTIAL/PLATELET
BASOS ABS: 0 10*3/uL (ref 0.0–0.2)
Basos: 0 %
EOS (ABSOLUTE): 0 10*3/uL (ref 0.0–0.4)
EOS: 0 %
HEMATOCRIT: 37.7 % (ref 34.0–46.6)
HEMOGLOBIN: 13.4 g/dL (ref 11.1–15.9)
IMMATURE GRANULOCYTES: 0 %
Immature Grans (Abs): 0 10*3/uL (ref 0.0–0.1)
Lymphocytes Absolute: 0.9 10*3/uL (ref 0.7–3.1)
Lymphs: 11 %
MCH: 28.8 pg (ref 26.6–33.0)
MCHC: 35.5 g/dL (ref 31.5–35.7)
MCV: 81 fL (ref 79–97)
MONOCYTES: 4 %
MONOS ABS: 0.3 10*3/uL (ref 0.1–0.9)
NEUTROS PCT: 85 %
Neutrophils Absolute: 7.1 10*3/uL — ABNORMAL HIGH (ref 1.4–7.0)
Platelets: 262 10*3/uL (ref 150–379)
RBC: 4.66 x10E6/uL (ref 3.77–5.28)
RDW: 14.1 % (ref 12.3–15.4)
WBC: 8.4 10*3/uL (ref 3.4–10.8)

## 2017-05-10 LAB — PROTIME-INR
INR: 1 (ref 0.8–1.2)
PROTHROMBIN TIME: 10.5 s (ref 9.1–12.0)

## 2017-05-10 LAB — PRO B NATRIURETIC PEPTIDE: NT-Pro BNP: 375 pg/mL — ABNORMAL HIGH (ref 0–301)

## 2017-05-10 MED ORDER — POTASSIUM CHLORIDE CRYS ER 20 MEQ PO TBCR
20.0000 meq | EXTENDED_RELEASE_TABLET | Freq: Every day | ORAL | 3 refills | Status: DC
Start: 2017-05-10 — End: 2017-05-13

## 2017-05-10 NOTE — Progress Notes (Signed)
*  PRELIMINARY RESULTS* Echocardiogram 2D Echocardiogram has been performed.  Cristela BlueHege, Genese Quebedeaux 05/10/2017, 10:47 AM

## 2017-05-10 NOTE — Telephone Encounter (Signed)
-----   Message from Erasmo DownerAngela M Bacigalupo, MD sent at 05/10/2017  8:15 AM EST ----- Liver function, kidney function, blood counts all normal.  BNP (measure for heart failure) is slightly high, but not high enough to diagnose heart failure.  Potassium is slightly low.  Start daily potassium supplement (Rx sent to pharmacy) with Lasix.  We will await more information from other tests and specialists.  Because all of this is happening this week, could move her f/u appt to late next week so she can see me again if she wants.  Erasmo DownerBacigalupo, Angela M, MD, MPH [facility] 05/10/2017 8:15 AM

## 2017-05-10 NOTE — Telephone Encounter (Signed)
Pt advised of results. FU appointment changed to Dr. Leonard SchwartzB on 05/19/2017.

## 2017-05-11 ENCOUNTER — Ambulatory Visit (INDEPENDENT_AMBULATORY_CARE_PROVIDER_SITE_OTHER): Payer: Medicare Other | Admitting: Gastroenterology

## 2017-05-11 ENCOUNTER — Other Ambulatory Visit: Payer: Self-pay

## 2017-05-11 ENCOUNTER — Ambulatory Visit
Admission: RE | Admit: 2017-05-11 | Discharge: 2017-05-11 | Disposition: A | Discharge status: Home / Self Care | Payer: Medicare Other | Source: Ambulatory Visit | Attending: Family Medicine | Admitting: Family Medicine

## 2017-05-11 ENCOUNTER — Encounter: Payer: Self-pay | Admitting: Gastroenterology

## 2017-05-11 VITALS — BP 162/77 | HR 87 | Temp 97.9°F | Ht 67.0 in | Wt 187.0 lb

## 2017-05-11 DIAGNOSIS — K219 Gastro-esophageal reflux disease without esophagitis: Secondary | ICD-10-CM | POA: Diagnosis not present

## 2017-05-11 DIAGNOSIS — N261 Atrophy of kidney (terminal): Secondary | ICD-10-CM | POA: Insufficient documentation

## 2017-05-11 DIAGNOSIS — R188 Other ascites: Secondary | ICD-10-CM | POA: Insufficient documentation

## 2017-05-11 DIAGNOSIS — K580 Irritable bowel syndrome with diarrhea: Secondary | ICD-10-CM

## 2017-05-11 DIAGNOSIS — R140 Abdominal distension (gaseous): Secondary | ICD-10-CM | POA: Diagnosis not present

## 2017-05-11 DIAGNOSIS — K589 Irritable bowel syndrome without diarrhea: Secondary | ICD-10-CM | POA: Insufficient documentation

## 2017-05-11 DIAGNOSIS — R932 Abnormal findings on diagnostic imaging of liver and biliary tract: Secondary | ICD-10-CM | POA: Insufficient documentation

## 2017-05-11 MED ORDER — RIFAXIMIN 550 MG PO TABS: 550.0000 mg | ORAL_TABLET | Freq: Three times a day (TID) | ORAL | 0 refills | Status: DC

## 2017-05-11 NOTE — Progress Notes (Signed)
Advised  ED 

## 2017-05-11 NOTE — Progress Notes (Signed)
Arlyss Repress, MD [address]  [address]  [place], [place] [address]  Main: [phone]  Fax: [phone]    Gastroenterology Consultation  Referring Provider:     Erasmo Downer, MD Primary Care Physician:  Erasmo Downer, MD Primary Gastroenterologist:  Dr. Arlyss Repress Reason for Consultation:     Abdominal distention, ? Liver disease        HPI:   Kara French is a 67 y.o. female referred by Dr. Beryle Flock, Marzella Schlein, MD  for consultation & management of abdominal distention. Patient reports that she fell on 04/20/17, has been experiencing back pain. Started taking Aleve twice daily. However, her pain got worse and she is taking Tylenol along with Aleve twice daily. She reports that she has also been craving for salt and has been eating more lately. She does have history of hypertension and her blood pressure has been running high lately. She noticed new onset of swelling in her legs as well as abdominal distention. Workup by her PCP revealed elevated BNP, normal echocardiogram, normal LFTs and kidney function. She had an ultrasound abdomen to evaluate for ascites. There was no evidence of ascites, other than fatty liver. She started on furosemide 20 mg along with potassium supplements. She just started taking it since yesterday. She reports that her swelling of legs has improved. She still feels significantly distended in her abdomen. She does report having a long history of IBS-diarrhea and is very careful with her diet. She knows her food triggers and avoids them. She takes naturally probiotic daily. She denies drinking carbonated beverages, alcohol or taking artificial sweeteners. She denies rectal bleeding, hematochezia, constipation or diarrhea. She does have history of chronic GERD which is under control on PPI. Her sister is diagnosed with colon cancer at age 67 and she has been undergoing colonoscopy initially every 3 years, later moved to every 5 years. Her  last colonoscopy was in 2017 which was reportedly normal. She is originally from [place], recently moved here to help her son who just had a baby.  NSAIDs: Aleve twice daily, started 2 weeks ago  Antiplts/Anticoagulants/Anti thrombotics: Aspirin 81 daily  GI Procedures: Reviewed EGD and colonoscopy on care everywhere EGD revealed short segment Barrett's, last EGD 09/2015, recommended repeat in 3 years Colonoscopy showed diverticulosis, last colonoscopy in 2017  Past Medical History:  Diagnosis Date  . Allergic rhinitis   . Barrett's esophagus 2017   biopsy confirmed, related to GERD  . GERD (gastroesophageal reflux disease)   . Hyperlipidemia   . Hypertension   . Mitral valve prolapse    followed by cardiology    Past Surgical History:  Procedure Laterality Date  . BARTHOLIN GLAND CYST EXCISION  1975  . BREAST ENHANCEMENT SURGERY Bilateral 1975  . LAPAROSCOPIC CHOLECYSTECTOMY  2011  . LAPAROSCOPIC OOPHERECTOMY Right 1987   2/2 benign tumor  . TRANSANAL EXCISION OF RECTAL MASS WITH HEMORRHOID INJECTION  1989   hemorrhoid removal  . TUBAL LIGATION  1987    Prior to Admission medications   Medication Sig Start Date End Date Taking? Authorizing Provider  aspirin 81 MG chewable tablet Chew 1 tablet by mouth daily.    [provider]  baclofen (LIORESAL) 10 MG tablet Take 1 tablet (10 mg total) by mouth 3 (three) times daily. Patient not taking: Reported on 05/09/2017 04/27/17   Erasmo Downer, MD  cetirizine (ZYRTEC) 10 MG tablet Take 10 mg by mouth daily.    [provider]  furosemide (LASIX) 20 MG tablet Take 1 tablet (20 mg total) by mouth daily. 05/09/17   Bacigalupo, Marzella SchleinAngela M, MD  HydroCHLOROthiazide (MICROZIDE PO) hydrochlorothiazide    [provider]  lansoprazole (PREVACID) 30 MG capsule Take by mouth.    [provider]  loratadine (CLARITIN) 10 MG tablet Take by mouth.    [provider]  losartan-hydrochlorothiazide  (HYZAAR) 100-25 MG tablet Take 1 tablet by mouth daily. 04/14/17   Bacigalupo, Marzella SchleinAngela M, MD  Metoprolol-Hydrochlorothiazide (DUTOPROL PO) metoprolol su-hydrochlorothiaz    [provider]  pantoprazole (PROTONIX) 40 MG tablet Take 1 tablet by mouth daily. 12/02/16   [provider]  polyethylene glycol (GAVILYTE-C) 240 g solution Gavilyte-C 240 gram-22.72 gram-6.72 gram-5.84 gram oral solution    [provider]  potassium chloride SA (K-DUR,KLOR-CON) 20 MEQ tablet Take 1 tablet (20 mEq total) by mouth daily. 05/10/17   Erasmo DownerBacigalupo, Angela M, MD  pravastatin (PRAVACHOL) 10 MG tablet Take 1 tablet by mouth daily. 12/02/16   [provider]  TOPROL XL 100 MG 24 hr tablet Take 1 tablet by mouth daily. 12/02/16   [provider]  valsartan-hydrochlorothiazide (DIOVAN-HCT) 320-25 MG tablet valsartan 320 mg-hydrochlorothiazide 25 mg tablet    [provider]    Family History  Problem Relation Age of Onset  . Dementia Mother 4575  . Heart disease Mother 5848       s/p 7 stents and CABG x3  . Colon cancer Sister 1643  . Heart disease Brother        s./p stent placement  . Heart disease Brother        s/p stent placement  . Congestive Heart Failure Maternal Grandmother 82  . Heart attack Maternal Grandfather 66  . Healthy Paternal Grandmother   . Breast cancer Paternal Aunt 4768     Social History   Tobacco Use  . Smoking status: Former Smoker    Packs/day: 0.50    Years: 15.00    Pack years: 7.50    Types: Cigarettes    Last attempt to quit: 04/18/1997    Years since quitting: 20.0  . Smokeless tobacco: Never Used  Substance Use Topics  . Alcohol use: No  . Drug use: No    Allergies as of 05/11/2017 - Review Complete 05/11/2017  Allergen Reaction Noted  . Aller-chlor  [chlorpheniramine]    . Benzalkonium chloride Other (See Comments) 03/24/2017  . Neomycin-bacitracin zn-polymyx  12/11/2012  . Tape  12/11/2012  . Decongestant  [pseudoephedrine hcl er] Itching 01/13/2017  . Iodinated diagnostic agents Rash and Hives 06/13/2016    Review of Systems:    All systems reviewed and negative except where noted in HPI.   Physical Exam:  BP (!) 162/77   Pulse 87   Temp 97.9 F (36.6 C) (Oral)   Ht 5\' 7"  (1.702 m)   Wt 187 lb (84.8 kg)   BMI 29.29 kg/m  No LMP recorded. Patient is postmenopausal.  General:   Alert,  Well-developed, well-nourished, pleasant and cooperative in NAD Head:  Normocephalic and atraumatic. Eyes:  Sclera clear, no icterus.   Conjunctiva pink. Ears:  Normal auditory acuity. Nose:  No deformity, discharge, or lesions. Mouth:  No deformity or lesions,oropharynx pink & moist. Neck:  Supple; no masses or thyromegaly. Lungs:  Respirations even and unlabored.  Clear throughout to auscultation.   No wheezes, crackles, or rhonchi. No acute distress. Heart:  Regular rate and rhythm; no murmurs, clicks, rubs, or gallops. Abdomen:  Normal bowel  sounds. Soft, non-tender and non-distended without masses, hepatosplenomegaly or hernias noted.  No guarding or rebound tenderness.   Rectal: Not performed Msk:  Symmetrical without gross deformities. Good, equal movement & strength bilaterally. Pulses:  Normal pulses noted. Extremities:  No clubbing or edema.  No cyanosis. Neurologic:  Alert and oriented x3;  grossly normal neurologically. Skin:  Intact without significant lesions or rashes. No jaundice. Lymph Nodes:  No significant cervical adenopathy. Psych:  Alert and cooperative. Normal mood and affect.  Imaging Studies: Ultrasound abdomen 05/11/2017 IMPRESSION: No definite ascites is observed on today's study.  Increased hepatic echotexture compatible with fatty infiltrative change. No hepatic masses are observed.  Mild bilateral renal cortical atrophy.  No hydronephrosis.  Assessment and Plan:   Mela Perham is a 67 y.o. Caucasian female with history of mitral valve prolapse,  hypertension, IBS-diarrhea, chronic GERD, short segment Barrett's, family history of colon cancer in first-degree relative, obesity is referred in consultation for further evaluation of abdominal distention and swelling of legs.  Volume overload: This is most likely secondary to poorly controlled hypertension, precipitated by NSAID intake and high sodium intake in the last 2 weeks -Echocardiogram revealed normal cardiac function -She does not have evidence of chronic liver disease, portal hypertension -Encouraged her to cut down on salt, avoid NSAIDs, continue Lasix and potassium  Abdominal distention: She may have mild flareup of IBS-diarrhea with recent NSAID use - Avoid NSAIDs - Will give empiric trial of rifaximin 550 mg 3 times daily for 2 weeks  - Also discussed about trial of probiotics if her bloating persists    GERD/heart segment Barrett's: - EGD in 2020  Colon cancer in first-degree relative: -Surveillance Colonoscopy in 2022   Follow up in 4 weeks    Arlyss Repress, MD

## 2017-05-11 NOTE — Progress Notes (Signed)
Patient said that it was a little better today.  Advised

## 2017-05-12 ENCOUNTER — Telehealth: Payer: Self-pay | Admitting: Family Medicine

## 2017-05-12 ENCOUNTER — Telehealth: Payer: Self-pay | Admitting: Gastroenterology

## 2017-05-12 NOTE — Telephone Encounter (Signed)
Please advise 

## 2017-05-12 NOTE — Telephone Encounter (Signed)
Klor Con is way to big for the patient to be able to swallow. She wants to know if there is a smaller capsule that is coated or even a liquid form. She uses [facility] on [address]

## 2017-05-12 NOTE — Telephone Encounter (Signed)
Patient called and the medication ???(patient can't remember the name? Is too expensive. What else can she take?

## 2017-05-13 ENCOUNTER — Other Ambulatory Visit: Payer: Self-pay

## 2017-05-13 MED ORDER — POTASSIUM CHLORIDE 40 MEQ/15ML (20%) PO SOLN: 7.5000 mL | Freq: Every day | ORAL | 3 refills | Status: DC

## 2017-05-13 MED ORDER — CIPROFLOXACIN HCL 500 MG PO TABS: 500.0000 mg | ORAL_TABLET | Freq: Two times a day (BID) | ORAL | 0 refills | Status: DC

## 2017-05-13 MED ORDER — METRONIDAZOLE 500 MG PO TABS: 500.0000 mg | ORAL_TABLET | Freq: Two times a day (BID) | ORAL | 0 refills | Status: DC

## 2017-05-13 NOTE — Telephone Encounter (Signed)
Rx sent in to pharmacy. 

## 2017-05-13 NOTE — Telephone Encounter (Signed)
Pt advised.

## 2017-05-13 NOTE — Telephone Encounter (Signed)
Liquid form of medication sent in to pharmacy.  Please let patient know to take this daily, instead of tablets.  Erasmo DownerBacigalupo, Angela M, MD, MPH [facility] 05/13/2017 9:22 AM

## 2017-05-18 ENCOUNTER — Encounter: Payer: Self-pay | Admitting: Gastroenterology

## 2017-05-19 ENCOUNTER — Ambulatory Visit (INDEPENDENT_AMBULATORY_CARE_PROVIDER_SITE_OTHER): Payer: Medicare Other | Admitting: Family Medicine

## 2017-05-19 ENCOUNTER — Encounter: Payer: Self-pay | Admitting: Family Medicine

## 2017-05-19 VITALS — BP 140/72 | HR 72 | Temp 98.7°F | Resp 16 | Wt 190.0 lb

## 2017-05-19 DIAGNOSIS — R140 Abdominal distension (gaseous): Secondary | ICD-10-CM | POA: Diagnosis not present

## 2017-05-19 DIAGNOSIS — R188 Other ascites: Secondary | ICD-10-CM

## 2017-05-19 MED ORDER — PRAVASTATIN SODIUM 10 MG PO TABS: 10.0000 mg | ORAL_TABLET | Freq: Every day | ORAL | 3 refills | Status: DC

## 2017-05-19 MED ORDER — METOPROLOL SUCCINATE ER 100 MG PO TB24: 100.0000 mg | ORAL_TABLET | Freq: Every day | ORAL | 3 refills | Status: DC

## 2017-05-19 NOTE — Assessment & Plan Note (Addendum)
Now resolved and not observed on abd US Could be related to IBS, NSAID use No evidence of CHF or liver disease kidney disease on recent testing Per GI, will finish a course of Cipro and Flagyl Discontinue Lasix and potassium supplements No further follow-up for this issue needed unless it recurs

## 2017-05-19 NOTE — Progress Notes (Signed)
Patient: Kara French Female    DOB: 05-26-1950   67 y.o.   MRN: [id number] Visit Date: 05/19/2017  Today's Provider: Shirlee Latch, MD   I, Joslyn Hy, CMA, am acting as scribe for Shirlee Latch, MD.  Chief Complaint  Patient presents with  . Ascites   Subjective:    HPI     Follow up for Ascites  The patient was last seen for this 2 weeks ago. Changes made at last visit include checking labs, CXR, echocardiogram and referring to GI and cardiology. Labs were WNL except for slightly elevated BNP and hypokalemia. Pt was started on Klor-Con and lasix. CXR was negative. Echo was normal. US Abdomen did not show observed ascites. Pt saw GI on 05/11/2017, and specialist believed abdominal swelling was secondary to IBS. Pt was advised to avoid NSAIDs, continue Klor-Con and lasix, as well as an empirical trial of Xifaxan 550 mg TID x 2 weeks.  Pt saw cardiology on 05/13/2017, and NM myocardial perfusion SPECT multiple (stress and rest)  and ECG stress test only were ordered.  She reports fair compliance with treatment. She is not taking the Klor-Con or Lasix. She states the lasix "completely dehydrated me". She is taking in more potassium through her diet.  She did not start the Xifaxin due to cost. GI started her on flagyl and Cipro instead,.  She feels that condition is Improved. She is not having side effects.   ------------------------------------------------------------------------------------    Allergies  Allergen Reactions  . Aller-Chlor  [Chlorpheniramine]   . Benzalkonium Chloride Other (See Comments)    BLISTERS  . Neomycin-Bacitracin Zn-Polymyx   . Tape   . Decongestant [Pseudoephedrine Hcl Er] Itching  . Iodinated Diagnostic Agents Rash and Hives     Current Outpatient Medications:  .  aspirin 81 MG chewable tablet, Chew 1 tablet by mouth daily., Disp: , Rfl:  .  cetirizine (ZYRTEC) 10 MG tablet, Take 10 mg by mouth daily., Disp: ,  Rfl:  .  ciprofloxacin (CIPRO) 500 MG tablet, Take 1 tablet (500 mg total) by mouth 2 (two) times daily., Disp: 20 tablet, Rfl: 0 .  losartan-hydrochlorothiazide (HYZAAR) 100-25 MG tablet, Take 1 tablet by mouth daily., Disp: 90 tablet, Rfl: 3 .  metroNIDAZOLE (FLAGYL) 500 MG tablet, Take 1 tablet (500 mg total) by mouth 2 (two) times daily., Disp: 20 tablet, Rfl: 0 .  pantoprazole (PROTONIX) 40 MG tablet, Take 1 tablet by mouth daily., Disp: , Rfl:  .  pravastatin (PRAVACHOL) 10 MG tablet, Take 1 tablet by mouth daily., Disp: , Rfl:  .  furosemide (LASIX) 20 MG tablet, Take 1 tablet (20 mg total) by mouth daily. (Patient not taking: Reported on 05/19/2017), Disp: 30 tablet, Rfl: 3 .  Potassium Chloride 40 MEQ/15ML (20%) SOLN, Take 7.5 mLs by mouth daily. (Patient not taking: Reported on 05/19/2017), Disp: 225 mL, Rfl: 3 .  rifaximin (XIFAXAN) 550 MG TABS tablet, Take 1 tablet (550 mg total) by mouth 3 (three) times daily for 14 days. (Patient not taking: Reported on 05/19/2017), Disp: 42 tablet, Rfl: 0  Review of Systems  Constitutional: Negative for activity change, appetite change, chills, diaphoresis, fatigue, fever and unexpected weight change.  Respiratory: Negative for shortness of breath.   Cardiovascular: Negative for chest pain, palpitations and leg swelling.  Gastrointestinal: Negative for abdominal distention and abdominal pain.    Social History   Tobacco Use  . Smoking status: Former Smoker    Packs/day: 0.50  Years: 15.00    Pack years: 7.50    Types: Cigarettes    Last attempt to quit: 04/18/1997    Years since quitting: 20.0  . Smokeless tobacco: Never Used  Substance Use Topics  . Alcohol use: No   Objective:   BP 140/72 (BP Location: Left Arm, Patient Position: Sitting, Cuff Size: Large)   Pulse 72   Temp 98.7 F (37.1 C) (Oral)   Resp 16   Wt 190 lb (86.2 kg)   SpO2 97%   BMI 29.76 kg/m  Vitals:   05/19/17 1339  BP: 140/72  Pulse: 72  Resp: 16  Temp:  98.7 F (37.1 C)  TempSrc: Oral  SpO2: 97%  Weight: 190 lb (86.2 kg)     Physical Exam  Constitutional: She is oriented to person, place, and time. She appears well-developed and well-nourished. No distress.  Cardiovascular: Normal rate, regular rhythm, normal heart sounds and intact distal pulses.  No murmur heard. Pulmonary/Chest: Effort normal and breath sounds normal. No respiratory distress. She has no wheezes. She has no rales.  Abdominal: Soft. She exhibits no distension. There is no tenderness. There is no rebound.  No ascites  Neurological: She is alert and oriented to person, place, and time.  Vitals reviewed.       Assessment & Plan:    Problem List Items Addressed This Visit      Other   Other ascites    Now resolved and not observed on abd US Could be related to IBS, NSAID use No evidence of CHF or liver disease kidney disease on recent testing Per GI, will finish a course of Cipro and Flagyl Discontinue Lasix and potassium supplements No further follow-up for this issue needed unless it recurs       Other Visit Diagnoses    Abdominal bloating    -  Primary       Return in about 2 months (around 07/17/2017) for as scheduled. for chronic disease f/u   The entirety of the information documented in the History of Present Illness, Review of Systems and Physical Exam were personally obtained by me. Portions of this information were initially documented by Irving BurtonEmily Ratchford, CMA and reviewed by me for thoroughness and accuracy.    Erasmo DownerBacigalupo, Angela M, MD, MPH [facility] 05/19/2017 2:13 PM

## 2017-05-19 NOTE — Telephone Encounter (Signed)
error 

## 2017-05-19 NOTE — Patient Instructions (Signed)
Abdominal Bloating °When you have abdominal bloating, your abdomen may feel full, tight, or painful. It may also look bigger than normal or swollen (distended). Common causes of abdominal bloating include: °· Swallowing air. °· Constipation. °· Problems digesting food. °· Eating too much. °· Irritable bowel syndrome. This is a condition that affects the large intestine. °· Lactose intolerance. This is an inability to digest lactose, a natural sugar in dairy products. °· Celiac disease. This is a condition that affects the ability to digest gluten, a protein found in some grains. °· Gastroparesis. This is a condition that slows down the movement of food in the stomach and small intestine. It is more common in people with diabetes mellitus. °· Gastroesophageal reflux disease (GERD). This is a digestive condition that makes stomach acid flow back into the esophagus. °· Urinary retention. This means that the body is holding onto urine, and the bladder cannot be emptied all the way. ° °Follow these instructions at home: °Eating and drinking °· Avoid eating too much. °· Try not to swallow air while talking or eating. °· Avoid eating while lying down. °· Avoid these foods and drinks: °? Foods that cause gas, such as broccoli, cabbage, cauliflower, and baked beans. °? Carbonated drinks. °? Hard candy. °? Chewing gum. °Medicines °· Take over-the-counter and prescription medicines only as told by your health care provider. °· Take probiotic medicines. These medicines contain live bacteria or yeasts that can help digestion. °· Take coated peppermint oil capsules. °Activity °· Try to exercise regularly. Exercise may help to relieve bloating that is caused by gas and relieve constipation. °General instructions °· Keep all follow-up visits as told by your health care provider. This is important. °Contact a health care provider if: °· You have nausea and vomiting. °· You have diarrhea. °· You have abdominal pain. °· You have  unusual weight loss or weight gain. °· You have severe pain, and medicines do not help. °Get help right away if: °· You have severe chest pain. °· You have trouble breathing. °· You have shortness of breath. °· You have trouble urinating. °· You have darker urine than normal. °· You have blood in your stools or have dark, tarry stools. °Summary °· Abdominal bloating means that the abdomen is swollen. °· Common causes of abdominal bloating are swallowing air, constipation, and problems digesting food. °· Avoid eating too much and avoid swallowing air. °· Avoid foods that cause gas, carbonated drinks, hard candy, and chewing gum. °This information is not intended to replace advice given to you by your health care provider. Make sure you discuss any questions you have with your health care provider. °Document Released: 05/07/2016 Document Revised: 05/07/2016 Document Reviewed: 05/07/2016 °Elsevier Interactive Patient Education © 2018 [employer] ° °

## 2017-05-23 ENCOUNTER — Ambulatory Visit: Payer: Self-pay | Admitting: Family Medicine

## 2017-05-24 ENCOUNTER — Other Ambulatory Visit: Payer: Self-pay | Admitting: Family Medicine

## 2017-05-25 NOTE — Telephone Encounter (Signed)
Called pt to verify she D/C this. She states she did D/C this due to side effects. Will refuse refill request.

## 2017-06-06 ENCOUNTER — Encounter: Payer: Self-pay | Admitting: Gastroenterology

## 2017-06-06 ENCOUNTER — Ambulatory Visit (INDEPENDENT_AMBULATORY_CARE_PROVIDER_SITE_OTHER): Payer: Medicare Other | Admitting: Gastroenterology

## 2017-06-06 ENCOUNTER — Ambulatory Visit (INDEPENDENT_AMBULATORY_CARE_PROVIDER_SITE_OTHER): Payer: Medicare Other

## 2017-06-06 VITALS — BP 160/90 | HR 88 | Temp 98.6°F | Ht 67.0 in | Wt 192.8 lb

## 2017-06-06 VITALS — BP 173/85 | HR 86 | Temp 97.6°F | Ht 67.0 in | Wt 190.3 lb

## 2017-06-06 DIAGNOSIS — Z0000 Encounter for general adult medical examination without abnormal findings: Secondary | ICD-10-CM | POA: Diagnosis not present

## 2017-06-06 DIAGNOSIS — K580 Irritable bowel syndrome with diarrhea: Secondary | ICD-10-CM | POA: Diagnosis not present

## 2017-06-06 DIAGNOSIS — R109 Unspecified abdominal pain: Secondary | ICD-10-CM | POA: Diagnosis not present

## 2017-06-06 DIAGNOSIS — R140 Abdominal distension (gaseous): Secondary | ICD-10-CM | POA: Diagnosis not present

## 2017-06-06 NOTE — Progress Notes (Signed)
Subjective:   Kara French is a 67 y.o. female who presents for an Initial Medicare Annual Wellness Visit.  Review of Systems    N/A  Cardiac Risk Factors include: advanced age (>36men, >8 women);hypertension;dyslipidemia;obesity (BMI >30kg/m2)     Objective:    Today's Vitals   06/06/17 1111 06/06/17 1115  BP: (!) 160/88 (!) 160/90  Pulse: 88   Temp: 98.6 F (37 C)   TempSrc: Oral   Weight: 192 lb 12.8 oz (87.5 kg)   Height: 5\' 7"  (1.702 m)   PainSc: 1  1   PainLoc: Back    Body mass index is 30.2 kg/m.  Advanced Directives 06/06/2017 01/13/2017 06/13/2016  Does Patient Have a Medical Advance Directive? No No No  Would patient like information on creating a medical advance directive? No - Patient declined - No - Patient declined    Current Medications (verified) Outpatient Encounter Medications as of 06/06/2017  Medication Sig  . aspirin 81 MG chewable tablet Chew 1 tablet by mouth daily.  . cetirizine (ZYRTEC) 10 MG tablet Take 10 mg by mouth daily.  Marland Kitchen losartan-hydrochlorothiazide (HYZAAR) 100-25 MG tablet Take 1 tablet by mouth daily.  . metoprolol succinate (TOPROL-XL) 100 MG 24 hr tablet Take 1 tablet (100 mg total) by mouth daily. Take with or immediately following a meal.  . pantoprazole (PROTONIX) 40 MG tablet Take 1 tablet by mouth daily.  . pravastatin (PRAVACHOL) 10 MG tablet Take 1 tablet (10 mg total) by mouth daily.  . [DISCONTINUED] ciprofloxacin (CIPRO) 500 MG tablet Take 1 tablet (500 mg total) by mouth 2 (two) times daily.  . [DISCONTINUED] metroNIDAZOLE (FLAGYL) 500 MG tablet Take 1 tablet (500 mg total) by mouth 2 (two) times daily.   No facility-administered encounter medications on file as of 06/06/2017.     Allergies (verified) Aller-chlor  [chlorpheniramine]; Benzalkonium chloride; Neomycin-bacitracin zn-polymyx; Tape; Decongestant [pseudoephedrine hcl er]; and Iodinated diagnostic agents   History: Past Medical History:  Diagnosis Date    . Allergic rhinitis   . Barrett's esophagus 2017   biopsy confirmed, related to GERD  . GERD (gastroesophageal reflux disease)   . Hyperlipidemia   . Hypertension   . Mitral valve prolapse    followed by cardiology   Past Surgical History:  Procedure Laterality Date  . BARTHOLIN GLAND CYST EXCISION  1975  . BREAST ENHANCEMENT SURGERY Bilateral 1975  . LAPAROSCOPIC CHOLECYSTECTOMY  2011  . LAPAROSCOPIC OOPHERECTOMY Right 1987   2/2 benign tumor  . TRANSANAL EXCISION OF RECTAL MASS WITH HEMORRHOID INJECTION  1989   hemorrhoid removal  . TUBAL LIGATION  1987   Family History  Problem Relation Age of Onset  . Dementia Mother 5  . Heart disease Mother 34       s/p 7 stents and CABG x3  . Colon cancer Sister 88  . Heart disease Brother        s./p stent placement  . Heart disease Brother        s/p stent placement  . Congestive Heart Failure Maternal Grandmother 82  . Heart attack Maternal Grandfather 66  . Healthy Paternal Grandmother   . Breast cancer Paternal Aunt 46   Social History   Socioeconomic History  . Marital status: Widowed    Spouse name: None  . Number of children: 1  . Years of education: post-graduate  . Highest education level: None  Social Needs  . Financial resource strain: Not hard at all  . Food insecurity - worry:  Never true  . Food insecurity - inability: Never true  . Transportation needs - medical: No  . Transportation needs - non-medical: No  Occupational History  . Occupation: retired    Comment: Human resources officerspeech therapist  Tobacco Use  . Smoking status: Former Smoker    Packs/day: 0.50    Years: 15.00    Pack years: 7.50    Types: Cigarettes    Last attempt to quit: 04/18/1997    Years since quitting: 20.1  . Smokeless tobacco: Never Used  Substance and Sexual Activity  . Alcohol use: No  . Drug use: No  . Sexual activity: No  Other Topics Concern  . None  Social History Narrative  . None    Tobacco Counseling Counseling given:  Not Answered   Clinical Intake:  Pre-visit preparation completed: Yes  Pain : 0-10 Pain Score: 1  Pain Location: Back Pain Orientation: Lower Pain Descriptors / Indicators: Constant, Sharp Pain Frequency: Constant     Nutritional Status: BMI > 30  Obese Nutritional Risks: None Diabetes: No  How often do you need to have someone help you when you read instructions, pamphlets, or other written materials from your doctor or pharmacy?: 1 - Never  Interpreter Needed?: No  Information entered by :: [facility]CMmarkoski, LPN   Activities of Daily Living In your present state of health, do you have any difficulty performing the following activities: 06/06/2017 01/13/2017  Hearing? N N  Vision? 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 Still can see out of left eye, just blurred. -  Difficulty concentrating or making decisions? N N  Walking or climbing stairs? N N  Dressing or bathing? N N  Doing errands, shopping? N N  Preparing Food and eating ? N -  Using the Toilet? N -  In the past six months, have you accidently leaked urine? N -  Do you have problems with loss of bowel control? N -  Managing your Medications? N -  Managing your Finances? N -  Housekeeping or managing your Housekeeping? N -  Some recent data might be hidden     Immunizations and Health Maintenance Immunization History  Administered Date(s) Administered  . Influenza, High Dose Seasonal PF 01/13/2017  . Pneumococcal Conjugate-13 06/07/2014  . Pneumococcal Polysaccharide-23 06/23/2015  . Tdap 03/17/2016   Health Maintenance Due  Topic Date Due  . MAMMOGRAM  08/29/1998  . DEXA SCAN  08/28/2013    Patient Care Team: Erasmo DownerBacigalupo, Angela M, MD as PCP - General (Family Medicine) Toney ReilVanga, Rohini Reddy, MD as Consulting Physician (Gastroenterology) Alwyn Peaallwood, Dwayne D, MD as Consulting Physician (Cardiology)  Indicate any recent Medical Services you may have received from other than [facility] providers in the past  year (date may be approximate).     Assessment:   This is a routine wellness examination for Liese.  Hearing/Vision screen Vision Screening Comments: [facility] for [facility] exams.  Dietary issues and exercise activities discussed: Current Exercise Habits: The patient does not participate in regular exercise at present, Exercise limited by: orthopedic condition(s)  Goals    . Exercise 3x per week (30 min per time)     [facility] and exercise 5 days a week for 30 minutes or more.       Depression Screen PHQ 2/9 Scores 06/06/2017 01/13/2017  PHQ - 2 Score 0 0    Fall Risk Fall Risk  06/06/2017 01/13/2017  Falls in the past year? No No    Is the patient's home free of loose throw rugs in walkways, pet beds, electrical cords, etc?   yes      Grab bars in the bathroom? yes      Handrails on the stairs? n/a      Adequate lighting?   yes  Timed Get Up and Go Performed N/A  Cognitive Function: Pt declined screening today.      Screening Tests Health Maintenance  Topic Date Due  . MAMMOGRAM  08/29/1998  . DEXA SCAN  08/28/2013  . COLONOSCOPY  09/04/2025  . TETANUS/TDAP  03/17/2026  . INFLUENZA VACCINE  Completed  . Hepatitis C Screening  Completed  . PNA vac Low Risk Adult  Completed    Qualifies for Shingles Vaccine? Due for Shingles vaccine. Declined my offer to administer today. Education has been provided regarding the importance of this vaccine. Pt has been advised to call her insurance company to determine her out of pocket expense. Advised she may also receive this vaccine at her local pharmacy or Health Dept. Verbalized acceptance and understanding.  Cancer Screenings: Lung: Low Dose CT Chest recommended if Age 39-80 years, 30 pack-year currently smoking OR have quit w/in 15years. Patient does not qualify. Breast: Up to date on Mammogram? Yes , pt declines referral for this.  Up to date of Bone  Density/Dexa? Yes, pt declined referral for this today.  Colorectal: Up to date  Additional Screenings:  Hepatitis C Screening: Up to date   Plan:  I have personally reviewed and addressed the Medicare Annual Wellness questionnaire and have noted the following in the patient's chart:  A. Medical and social history B. Use of alcohol, tobacco or illicit drugs  C. Current medications and supplements D. Functional ability and status E.  Nutritional status F.  Physical activity G. Advance directives H. List of other physicians I.  Hospitalizations, surgeries, and ER visits in previous 12 months J.  Vitals K. Screenings such as hearing and vision if needed, cognitive and depression L. Referrals and appointments - none  In addition, I have reviewed and discussed with patient certain preventive protocols, quality metrics, and best practice recommendations. A written personalized care plan for preventive services as well as general preventive health recommendations were provided to patient.  See attached scanned questionnaire for additional information.   Signed,  Hyacinth Meeker, LPN Nurse Health Advisor   Nurse Recommendations: Pt declines the mammogram and DEXA scan referrals today. B/P was elevated today. Advised pt to monitor this daily.

## 2017-06-06 NOTE — Progress Notes (Signed)
Arlyss Repress, MD [address]  [address]  [place], [place] [address]  Main: [phone]  Fax: [phone]    Gastroenterology Consultation  Referring Provider:     Erasmo Downer, MD Primary Care Physician:  Erasmo Downer, MD Primary Gastroenterologist:  Dr. Arlyss Repress Reason for Consultation:    IBS        HPI:   Kara French is a 67 y.o. female referred by Dr. Beryle Flock, Marzella Schlein, MD  for consultation & management of abdominal distention. Patient reports that she fell on 04/20/17, has been experiencing back pain. Started taking Aleve twice daily. However, her pain got worse and she is taking Tylenol along with Aleve twice daily. She reports that she has also been craving for salt and has been eating more lately. She does have history of hypertension and her blood pressure has been running high lately. She noticed new onset of swelling in her legs as well as abdominal distention. Workup by her PCP revealed elevated BNP, normal echocardiogram, normal LFTs and kidney function. She had an ultrasound abdomen to evaluate for ascites. There was no evidence of ascites, other than fatty liver. She started on furosemide 20 mg along with potassium supplements. She just started taking it since yesterday. She reports that her swelling of legs has improved. She still feels significantly distended in her abdomen. She does report having a long history of IBS-diarrhea and is very careful with her diet. She knows her food triggers and avoids them. She takes naturally probiotic daily. She denies drinking carbonated beverages, alcohol or taking artificial sweeteners. She denies rectal bleeding, hematochezia, constipation or diarrhea. She does have history of chronic GERD which is under control on PPI. Her sister is diagnosed with colon cancer at age 18 and she has been undergoing colonoscopy initially every 3 years, later moved to every 5 years. Her last colonoscopy was in 2017 which  was reportedly normal. She is originally from [place], recently moved here to help her son who just had a baby.  Follow-up visit 06/06/2017 Patient received 2 weeks course of Cipro and Flagyl. This resulted in significant improvement of her symptoms including distention and lower abdominal pain. She is not taking NSAIDs anymore. Her swelling of legs has completely resolved. She is not on diuretics as well. She denies any GI complaints today otherwise.  NSAIDs: Aleve twice daily, started 2 weeks ago  Antiplts/Anticoagulants/Anti thrombotics: Aspirin 81 daily  GI Procedures: Reviewed EGD and colonoscopy on care everywhere EGD revealed short segment Barrett's, last EGD 09/2015, recommended repeat in 3 years Colonoscopy showed diverticulosis, last colonoscopy in 2017  Past Medical History:  Diagnosis Date  . Allergic rhinitis   . Barrett's esophagus 2017   biopsy confirmed, related to GERD  . GERD (gastroesophageal reflux disease)   . Hyperlipidemia   . Hypertension   . Mitral valve prolapse    followed by cardiology    Past Surgical History:  Procedure Laterality Date  . BARTHOLIN GLAND CYST EXCISION  1975  . BREAST ENHANCEMENT SURGERY Bilateral 1975  . LAPAROSCOPIC CHOLECYSTECTOMY  2011  . LAPAROSCOPIC OOPHERECTOMY Right 1987   2/2 benign tumor  . TRANSANAL EXCISION OF RECTAL MASS WITH HEMORRHOID INJECTION  1989   hemorrhoid removal  . TUBAL LIGATION  1987    Prior to Admission medications   Medication Sig Start Date End Date Taking? Authorizing Provider  aspirin 81 MG chewable tablet Chew 1 tablet by mouth daily.    [provider]  baclofen (LIORESAL) 10 MG tablet Take 1 tablet (10 mg total) by mouth 3 (three) times daily. Patient not taking: Reported on 05/09/2017 04/27/17   Erasmo Downer, MD  cetirizine (ZYRTEC) 10 MG tablet Take 10 mg by mouth daily.    [provider]  furosemide (LASIX) 20 MG tablet Take 1 tablet (20 mg total) by mouth daily.  05/09/17   Bacigalupo, Marzella Schlein, MD  HydroCHLOROthiazide (MICROZIDE PO) hydrochlorothiazide    [provider]  lansoprazole (PREVACID) 30 MG capsule Take by mouth.    [provider]  loratadine (CLARITIN) 10 MG tablet Take by mouth.    [provider]  losartan-hydrochlorothiazide (HYZAAR) 100-25 MG tablet Take 1 tablet by mouth daily. 04/14/17   Bacigalupo, Marzella Schlein, MD  Metoprolol-Hydrochlorothiazide (DUTOPROL PO) metoprolol su-hydrochlorothiaz    [provider]  pantoprazole (PROTONIX) 40 MG tablet Take 1 tablet by mouth daily. 12/02/16   [provider]  polyethylene glycol (GAVILYTE-C) 240 g solution Gavilyte-C 240 gram-22.72 gram-6.72 gram-5.84 gram oral solution    [provider]  potassium chloride SA (K-DUR,KLOR-CON) 20 MEQ tablet Take 1 tablet (20 mEq total) by mouth daily. 05/10/17   Erasmo Downer, MD  pravastatin (PRAVACHOL) 10 MG tablet Take 1 tablet by mouth daily. 12/02/16   [provider]  TOPROL XL 100 MG 24 hr tablet Take 1 tablet by mouth daily. 12/02/16   [provider]  valsartan-hydrochlorothiazide (DIOVAN-HCT) 320-25 MG tablet valsartan 320 mg-hydrochlorothiazide 25 mg tablet    [provider]    Family History  Problem Relation Age of Onset  . Dementia Mother 74  . Heart disease Mother 55       s/p 7 stents and CABG x3  . Colon cancer Sister 45  . Heart disease Brother        s./p stent placement  . Heart disease Brother        s/p stent placement  . Congestive Heart Failure Maternal Grandmother 82  . Heart attack Maternal Grandfather 66  . Healthy Paternal Grandmother   . Breast cancer Paternal Aunt 2     Social History   Tobacco Use  . Smoking status: Former Smoker    Packs/day: 0.50    Years: 15.00    Pack years: 7.50    Types: Cigarettes    Last attempt to quit: 04/18/1997    Years since quitting: 20.1  . Smokeless tobacco: Never Used  Substance Use Topics    . Alcohol use: No  . Drug use: No    Allergies as of 06/06/2017 - Review Complete 06/06/2017  Allergen Reaction Noted  . Aller-chlor  [chlorpheniramine]    . Benzalkonium chloride Other (See Comments) 03/24/2017  . Neomycin-bacitracin zn-polymyx  12/11/2012  . Tape  12/11/2012  . Decongestant [pseudoephedrine hcl er] Itching 01/13/2017  . Iodinated diagnostic agents Rash and Hives 06/13/2016    Review of Systems:    All systems reviewed and negative except where noted in HPI.   Physical Exam:  BP (!) 173/85   Pulse 86   Temp 97.6 F (36.4 C) (Oral)   Ht 5\' 7"  (1.702 m)   Wt 190 lb 4.8 oz (86.3 kg)   BMI 29.81 kg/m  No LMP recorded. Patient is postmenopausal.  General:   Alert,  Well-developed, well-nourished, pleasant and cooperative in NAD Head:  Normocephalic and atraumatic. Eyes:  Sclera clear, no icterus.   Conjunctiva pink. Ears:  Normal auditory acuity. Nose:  No deformity, discharge, or lesions. Mouth:  No deformity or lesions,oropharynx pink & moist. Neck:  Supple; no masses or thyromegaly. Lungs:  Respirations even and unlabored.  Clear throughout to auscultation.   No wheezes, crackles, or rhonchi. No acute distress. Heart:  Regular rate and rhythm; no murmurs, clicks, rubs, or gallops. Abdomen:  Normal bowel sounds. Soft, non-tender and non-distended without masses, hepatosplenomegaly or hernias noted.  No guarding or rebound tenderness.   Rectal: Not performed Msk:  Symmetrical without gross deformities. Good, equal movement & strength bilaterally. Pulses:  Normal pulses noted. Extremities:  No clubbing or edema.  No cyanosis. Neurologic:  Alert and oriented x3;  grossly normal neurologically. Skin:  Intact without significant lesions or rashes. No jaundice. Lymph Nodes:  No significant cervical adenopathy. Psych:  Alert and cooperative. Normal mood and affect.  Imaging Studies: Ultrasound abdomen 05/11/2017 IMPRESSION: No definite ascites is observed  on today's study.  Increased hepatic echotexture compatible with fatty infiltrative change. No hepatic masses are observed.  Mild bilateral renal cortical atrophy.  No hydronephrosis.  Assessment and Plan:   Kara French is a 67 y.o. Caucasian female with history of mitral valve prolapse, hypertension, IBS-diarrhea, chronic GERD, short segment Barrett's, family history of colon cancer in first-degree relative, obesity is here for follow-up of abdominal distention and swelling of legs. Swelling of legs has resolved  Abdominal distention: She probably had mild flareup of IBS-diarrhea with recent NSAID use Flareup resolved with empiric trial of Cipro and Flagyl, recommend to repeat another course if symptoms recur She can start taking probiotics, like align or VSL #3  Continue to avoid NSAIDs   GERD/heart segment Barrett's: - EGD in 2020  Colon cancer in first-degree relative: -Surveillance Colonoscopy in 2022  Follow up as needed    Arlyss Repressohini R Kedarius Aloisi, MD

## 2017-06-06 NOTE — Patient Instructions (Addendum)
[name] , Thank you for taking time to come for your Medicare Wellness Visit. I appreciate your ongoing commitment to your health goals. Please review the following plan we discussed and let me know if I can assist you in the future.   Screening recommendations/referrals: Colonoscopy: Up to date Mammogram: Pt declines today.  Bone Density: Pt declines today.  Recommended yearly ophthalmology/optometry visit for glaucoma screening and checkup Recommended yearly dental visit for hygiene and checkup  Vaccinations: Influenza vaccine: Up to date Pneumococcal vaccine: Up to date Tdap vaccine: Up to date Shingles vaccine: Pt declines today.     Advanced directives: Advance directive discussed with you today. Even though you declined this today please call our office should you change your mind and we can give you the proper paperwork for you to fill out.  Conditions/risks identified: Obesity- [facility], and exercise 5 days a week for 30 minutes or more.   Next appointment: 07/13/17 @ 2:00 PM with Dr B.   Preventive Care 365 Years and Older, Female Preventive care refers to lifestyle choices and visits with your health care provider that can promote health and wellness. What does preventive care include?  A yearly physical exam. This is also called an annual well check.  Dental exams once or twice a year.  Routine eye exams. Ask your health care provider how often you should have your eyes checked.  Personal lifestyle choices, including:  Daily care of your teeth and gums.  Regular physical activity.  Eating a healthy diet.  Avoiding tobacco and drug use.  Limiting alcohol use.  Practicing safe sex.  Taking low-dose aspirin every day.  Taking vitamin and mineral supplements as recommended by your health care provider. What happens during an annual well check? The services and screenings done by your health care provider during your  annual well check will depend on your age, overall health, lifestyle risk factors, and family history of disease. Counseling  Your health care provider may ask you questions about your:  Alcohol use.  Tobacco use.  Drug use.  Emotional well-being.  Home and relationship well-being.  Sexual activity.  Eating habits.  History of falls.  Memory and ability to understand (cognition).  Work and work Astronomerenvironment.  Reproductive health. Screening  You may have the following tests or measurements:  Height, weight, and BMI.  Blood pressure.  Lipid and cholesterol levels. These may be checked every 5 years, or more frequently if you are over 69 years old.  Skin check.  Lung cancer screening. You may have this screening every year starting at age 69 if you have a 30-pack-year history of smoking and currently smoke or have quit within the past 15 years.  Fecal occult blood test (FOBT) of the stool. You may have this test every year starting at age 69.  Flexible sigmoidoscopy or colonoscopy. You may have a sigmoidoscopy every 5 years or a colonoscopy every 10 years starting at age 69.  Hepatitis C blood test.  Hepatitis B blood test.  Sexually transmitted disease (STD) testing.  Diabetes screening. This is done by checking your blood sugar (glucose) after you have not eaten for a while (fasting). You may have this done every 1-3 years.  Bone density scan. This is done to screen for osteoporosis. You may have this done starting at age 69.  Mammogram. This may be done every 1-2 years. Talk to your health care provider about how often you  should have regular mammograms. Talk with your health care provider about your test results, treatment options, and if necessary, the need for more tests. Vaccines  Your health care provider may recommend certain vaccines, such as:  Influenza vaccine. This is recommended every year.  Tetanus, diphtheria, and acellular pertussis (Tdap, Td)  vaccine. You may need a Td booster every 10 years.  Zoster vaccine. You may need this after age 39.  Pneumococcal 13-valent conjugate (PCV13) vaccine. One dose is recommended after age 84.  Pneumococcal polysaccharide (PPSV23) vaccine. One dose is recommended after age 17. Talk to your health care provider about which screenings and vaccines you need and how often you need them. This information is not intended to replace advice given to you by your health care provider. Make sure you discuss any questions you have with your health care provider. Document Released: 05/02/2015 Document Revised: 12/24/2015 Document Reviewed: 02/04/2015 Elsevier Interactive Patient Education  2017 [place] Prevention in the Home Falls can cause injuries. They can happen to people of all ages. There are many things you can do to make your home safe and to help prevent falls. What can I do on the outside of my home?  Regularly fix the edges of walkways and driveways and fix any cracks.  Remove anything that might make you trip as you walk through a door, such as a raised step or threshold.  Trim any bushes or trees on the path to your home.  Use bright outdoor lighting.  Clear any walking paths of anything that might make someone trip, such as rocks or tools.  Regularly check to see if handrails are loose or broken. Make sure that both sides of any steps have handrails.  Any raised decks and porches should have guardrails on the edges.  Have any leaves, snow, or ice cleared regularly.  Use sand or salt on walking paths during winter.  Clean up any spills in your garage right away. This includes oil or grease spills. What can I do in the bathroom?  Use night lights.  Install grab bars by the toilet and in the tub and shower. Do not use towel bars as grab bars.  Use non-skid mats or decals in the tub or shower.  If you need to sit down in the shower, use a plastic, non-slip  stool.  Keep the floor dry. Clean up any water that spills on the floor as soon as it happens.  Remove soap buildup in the tub or shower regularly.  Attach bath mats securely with double-sided non-slip rug tape.  Do not have throw rugs and other things on the floor that can make you trip. What can I do in the bedroom?  Use night lights.  Make sure that you have a light by your bed that is easy to reach.  Do not use any sheets or blankets that are too big for your bed. They should not hang down onto the floor.  Have a firm chair that has side arms. You can use this for support while you get dressed.  Do not have throw rugs and other things on the floor that can make you trip. What can I do in the kitchen?  Clean up any spills right away.  Avoid walking on wet floors.  Keep items that you use a lot in easy-to-reach places.  If you need to reach something above you, use a strong step stool that has a grab bar.  Keep electrical cords out  of the way.  Do not use floor polish or wax that makes floors slippery. If you must use wax, use non-skid floor wax.  Do not have throw rugs and other things on the floor that can make you trip. What can I do with my stairs?  Do not leave any items on the stairs.  Make sure that there are handrails on both sides of the stairs and use them. Fix handrails that are broken or loose. Make sure that handrails are as long as the stairways.  Check any carpeting to make sure that it is firmly attached to the stairs. Fix any carpet that is loose or worn.  Avoid having throw rugs at the top or bottom of the stairs. If you do have throw rugs, attach them to the floor with carpet tape.  Make sure that you have a light switch at the top of the stairs and the bottom of the stairs. If you do not have them, ask someone to add them for you. What else can I do to help prevent falls?  Wear shoes that:  Do not have high heels.  Have rubber bottoms.  Are  comfortable and fit you well.  Are closed at the toe. Do not wear sandals.  If you use a stepladder:  Make sure that it is fully opened. Do not climb a closed stepladder.  Make sure that both sides of the stepladder are locked into place.  Ask someone to hold it for you, if possible.  Clearly mark and make sure that you can see:  Any grab bars or handrails.  First and last steps.  Where the edge of each step is.  Use tools that help you move around (mobility aids) if they are needed. These include:  Canes.  Walkers.  Scooters.  Crutches.  Turn on the lights when you go into a dark area. Replace any light bulbs as soon as they burn out.  Set up your furniture so you have a clear path. Avoid moving your furniture around.  If any of your floors are uneven, fix them.  If there are any pets around you, be aware of where they are.  Review your medicines with your doctor. Some medicines can make you feel dizzy. This can increase your chance of falling. Ask your doctor what other things that you can do to help prevent falls. This information is not intended to replace advice given to you by your health care provider. Make sure you discuss any questions you have with your health care provider. Document Released: 01/30/2009 Document Revised: 09/11/2015 Document Reviewed: 05/10/2014 Elsevier Interactive Patient Education  2017 [employer].

## 2017-07-13 ENCOUNTER — Ambulatory Visit (INDEPENDENT_AMBULATORY_CARE_PROVIDER_SITE_OTHER): Payer: Medicare Other | Admitting: Family Medicine

## 2017-07-13 ENCOUNTER — Encounter: Payer: Self-pay | Admitting: Family Medicine

## 2017-07-13 VITALS — BP 140/74 | HR 68 | Temp 97.9°F | Resp 16 | Ht 67.0 in | Wt 187.0 lb

## 2017-07-13 DIAGNOSIS — I10 Essential (primary) hypertension: Secondary | ICD-10-CM | POA: Diagnosis not present

## 2017-07-13 DIAGNOSIS — Z0000 Encounter for general adult medical examination without abnormal findings: Secondary | ICD-10-CM | POA: Diagnosis not present

## 2017-07-13 NOTE — Patient Instructions (Signed)
Preventive Care 65 Years and Older, Female Preventive care refers to lifestyle choices and visits with your health care provider that can promote health and wellness. What does preventive care include?  A yearly physical exam. This is also called an annual well check.  Dental exams once or twice a year.  Routine eye exams. Ask your health care provider how often you should have your eyes checked.  Personal lifestyle choices, including: ? Daily care of your teeth and gums. ? Regular physical activity. ? Eating a healthy diet. ? Avoiding tobacco and drug use. ? Limiting alcohol use. ? Practicing safe sex. ? Taking low-dose aspirin every day. ? Taking vitamin and mineral supplements as recommended by your health care provider. What happens during an annual well check? The services and screenings done by your health care provider during your annual well check will depend on your age, overall health, lifestyle risk factors, and family history of disease. Counseling Your health care provider may ask you questions about your:  Alcohol use.  Tobacco use.  Drug use.  Emotional well-being.  Home and relationship well-being.  Sexual activity.  Eating habits.  History of falls.  Memory and ability to understand (cognition).  Work and work environment.  Reproductive health.  Screening You may have the following tests or measurements:  Height, weight, and BMI.  Blood pressure.  Lipid and cholesterol levels. These may be checked every 5 years, or more frequently if you are over 50 years old.  Skin check.  Lung cancer screening. You may have this screening every year starting at age 55 if you have a 30-pack-year history of smoking and currently smoke or have quit within the past 15 years.  Fecal occult blood test (FOBT) of the stool. You may have this test every year starting at age 50.  Flexible sigmoidoscopy or colonoscopy. You may have a sigmoidoscopy every 5 years or  a colonoscopy every 10 years starting at age 50.  Hepatitis C blood test.  Hepatitis B blood test.  Sexually transmitted disease (STD) testing.  Diabetes screening. This is done by checking your blood sugar (glucose) after you have not eaten for a while (fasting). You may have this done every 1-3 years.  Bone density scan. This is done to screen for osteoporosis. You may have this done starting at age 65.  Mammogram. This may be done every 1-2 years. Talk to your health care provider about how often you should have regular mammograms.  Talk with your health care provider about your test results, treatment options, and if necessary, the need for more tests. Vaccines Your health care provider may recommend certain vaccines, such as:  Influenza vaccine. This is recommended every year.  Tetanus, diphtheria, and acellular pertussis (Tdap, Td) vaccine. You may need a Td booster every 10 years.  Varicella vaccine. You may need this if you have not been vaccinated.  Zoster vaccine. You may need this after age 60.  Measles, mumps, and rubella (MMR) vaccine. You may need at least one dose of MMR if you were born in 1957 or later. You may also need a second dose.  Pneumococcal 13-valent conjugate (PCV13) vaccine. One dose is recommended after age 65.  Pneumococcal polysaccharide (PPSV23) vaccine. One dose is recommended after age 65.  Meningococcal vaccine. You may need this if you have certain conditions.  Hepatitis A vaccine. You may need this if you have certain conditions or if you travel or work in places where you may be exposed to hepatitis   A.  Hepatitis B vaccine. You may need this if you have certain conditions or if you travel or work in places where you may be exposed to hepatitis B.  Haemophilus influenzae type b (Hib) vaccine. You may need this if you have certain conditions.  Talk to your health care provider about which screenings and vaccines you need and how often you  need them. This information is not intended to replace advice given to you by your health care provider. Make sure you discuss any questions you have with your health care provider. Document Released: 05/02/2015 Document Revised: 12/24/2015 Document Reviewed: 02/04/2015 Elsevier Interactive Patient Education  2018 [employer] Inc[employer]  

## 2017-07-13 NOTE — Progress Notes (Deleted)
Patient: Kara French, Female    DOB: Mar 09, 1951, 67 y.o.   MRN: [id number] Visit Date: 07/13/2017  Today's Provider: Shirlee Latch, MD   I, Joslyn Hy, CMA, am acting as scribe for Shirlee Latch, MD.  No chief complaint on file.  Subjective:    Annual wellness visit Kara French is a 67 y.o. female. She feels {DESC; WELL/FAIRLY WELL/POORLY:18703}. She reports exercising ***. She reports she is sleeping {DESC; WELL/FAIRLY WELL/POORLY:18703}.  -----------------------------------------------------------   Review of Systems  Social History   Socioeconomic History  . Marital status: Widowed    Spouse name: Not on file  . Number of children: 1  . Years of education: post-graduate  . Highest education level: Not on file  Occupational History  . Occupation: retired    Comment: Human resources officer  Social Needs  . Financial resource strain: Not hard at all  . Food insecurity:    Worry: Never true    Inability: Never true  . Transportation needs:    Medical: No    Non-medical: No  Tobacco Use  . Smoking status: Former Smoker    Packs/day: 0.50    Years: 15.00    Pack years: 7.50    Types: Cigarettes    Last attempt to quit: 04/18/1997    Years since quitting: 20.2  . Smokeless tobacco: Never Used  Substance and Sexual Activity  . Alcohol use: No  . Drug use: No  . Sexual activity: Never  Lifestyle  . Physical activity:    Days per week: Not on file    Minutes per session: Not on file  . Stress: Not at all  Relationships  . Social connections:    Talks on phone: Not on file    Gets together: Not on file    Attends religious service: Not on file    Active member of club or organization: Not on file    Attends meetings of clubs or organizations: Not on file    Relationship status: Not on file  . Intimate partner violence:    Fear of current or ex partner: Not on file    Emotionally abused: Not on file    Physically abused: Not on file     Forced sexual activity: Not on file  Other Topics Concern  . Not on file  Social History Narrative  . Not on file    Past Medical History:  Diagnosis Date  . Allergic rhinitis   . Barrett's esophagus 2017   biopsy confirmed, related to GERD  . GERD (gastroesophageal reflux disease)   . Hyperlipidemia   . Hypertension   . Mitral valve prolapse    followed by cardiology     Patient Active Problem List   Diagnosis Date Noted  . IBS (irritable bowel syndrome) 05/11/2017  . Other ascites 05/09/2017  . Shortness of breath 05/09/2017  . Hypertension   . Hyperlipidemia   . Allergic rhinitis   . GERD (gastroesophageal reflux disease)   . Mitral valve prolapse   . Barrett's esophagus 04/20/2015    Past Surgical History:  Procedure Laterality Date  . BARTHOLIN GLAND CYST EXCISION  1975  . BREAST ENHANCEMENT SURGERY Bilateral 1975  . LAPAROSCOPIC CHOLECYSTECTOMY  2011  . LAPAROSCOPIC OOPHERECTOMY Right 1987   2/2 benign tumor  . TRANSANAL EXCISION OF RECTAL MASS WITH HEMORRHOID INJECTION  1989   hemorrhoid removal  . TUBAL LIGATION  1987    Her family history includes Breast cancer (age of  onset: 4868) in her paternal aunt; Colon cancer (age of onset: 9143) in her sister; Congestive Heart Failure (age of onset: 6082) in her maternal grandmother; Dementia (age of onset: 3775) in her mother; Healthy in her paternal grandmother; Heart attack (age of onset: 5666) in her maternal grandfather; Heart disease in her brother and brother; Heart disease (age of onset: 4248) in her mother.      Current Outpatient Medications:  .  aspirin 81 MG chewable tablet, Chew 1 tablet by mouth daily., Disp: , Rfl:  .  cetirizine (ZYRTEC) 10 MG tablet, Take 10 mg by mouth daily., Disp: , Rfl:  .  losartan-hydrochlorothiazide (HYZAAR) 100-25 MG tablet, Take 1 tablet by mouth daily., Disp: 90 tablet, Rfl: 3 .  metoprolol succinate (TOPROL-XL) 100 MG 24 hr tablet, Take 1 tablet (100 mg total) by mouth daily.  Take with or immediately following a meal., Disp: 90 tablet, Rfl: 3 .  pantoprazole (PROTONIX) 40 MG tablet, Take 1 tablet by mouth daily., Disp: , Rfl:  .  pravastatin (PRAVACHOL) 10 MG tablet, Take 1 tablet (10 mg total) by mouth daily., Disp: 90 tablet, Rfl: 3  Patient Care Team: Erasmo DownerBacigalupo, Angela M, MD as PCP - General (Family Medicine) Toney ReilVanga, Rohini Reddy, MD as Consulting Physician (Gastroenterology)     Objective:   Vitals: There were no vitals taken for this visit.  Physical Exam  Activities of Daily Living In your present state of health, do you have any difficulty performing the following activities: 06/06/2017 01/13/2017  Hearing? N N  Vision? 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 Still can see out of left eye, just blurred. -  Difficulty concentrating or making decisions? N N  Walking or climbing stairs? N N  Dressing or bathing? N N  Doing errands, shopping? N N  Preparing Food and eating ? N -  Using the Toilet? N -  In the past six months, have you accidently leaked urine? N -  Do you have problems with loss of bowel control? N -  Managing your Medications? N -  Managing your Finances? N -  Housekeeping or managing your Housekeeping? N -  Some recent data might be hidden    Fall Risk Assessment Fall Risk  06/06/2017 01/13/2017  Falls in the past year? No No     Depression Screen PHQ 2/9 Scores 06/06/2017 01/13/2017  PHQ - 2 Score 0 0    Cognitive Testing - 6-CIT  Correct? Score   What year is it? {yes no:22349} {0-4:31231} 0 or 4  What month is it? {yes no:22349} {0-3:21082} 0 or 3  Memorize:    Floyde ParkinsJohn,  [place],  40,  [address],  [place],      What time is it? (within 1 hour) {yes no:22349} {0-3:21082} 0 or 3  Count backwards from 20 {yes no:22349} {0-4:31231} 0, 2, or 4  Name the months of the year {yes no:22349} {0-4:31231} 0, 2, or 4  Repeat name & address above {yes no:22349} {0-10:5044} 0, 2, 4, 6, 8, or 10       TOTAL SCORE  ***/28     Interpretation:  {normal/abnormal:11317::"Normal"}  Normal (0-7) Abnormal (8-28)       Assessment & Plan:     Annual Wellness Visit  Reviewed patient's Family Medical History Reviewed and updated list of patient's medical providers Assessment of cognitive impairment was done Assessed patient's functional ability Established a written schedule for health screening services Health Risk Assessent Completed and Reviewed  Exercise Activities and Dietary recommendations Goals    . Exercise 3x per week (30 min per time)     [facility] and exercise 5 days a week for 30 minutes or more.        Immunization History  Administered Date(s) Administered  . Influenza, High Dose Seasonal PF 01/13/2017  . Pneumococcal Conjugate-13 06/07/2014  . Pneumococcal Polysaccharide-23 06/23/2015  . Tdap 03/17/2016    Health Maintenance  Topic Date Due  . MAMMOGRAM  08/29/1998  . DEXA SCAN  08/28/2013  . COLONOSCOPY  09/04/2025  . TETANUS/TDAP  03/17/2026  . INFLUENZA VACCINE  Completed  . Hepatitis C Screening  Completed  . PNA vac Low Risk Adult  Completed     Discussed health benefits of physical activity, and encouraged her to engage in regular exercise appropriate for her age and condition.    ------------------------------------------------------------------------------------------------------------

## 2017-07-13 NOTE — Progress Notes (Signed)
Patient: Kara French, Female    DOB: [birth year], 67 y.o.   MRN: [id number] Visit Date: 07/13/2017  Today's Provider: Shirlee Latch, MD   I, Joslyn Hy, CMA, am acting as scribe for Shirlee Latch, MD.  Chief Complaint  Patient presents with  . Annual Exam   Subjective:     Complete Physical Kara French is a 67 y.o. female. She feels well. She reports exercising 5 days per week for 20 minutes. Joined Silver Sneakers. She reports she is sleeping well.  Last colonoscopy- 09/05/2015- diverticulosis, non-bleeding internal hemorrhoids. Repeat 5 years. Pt states her last mammogram was 2 years ago. She was advised by the tech to not receive a "compression" breast screening due to having breast implants that are 69 years old. She denies breast changes. -----------------------------------------------------------   Review of Systems  Constitutional: Negative.   HENT: Negative.   Eyes: Negative.   Respiratory: Negative.   Cardiovascular: Negative.   Gastrointestinal: Negative.   Endocrine: Negative.   Genitourinary: Negative.   Musculoskeletal: Negative.   Skin: Negative.   Allergic/Immunologic: Negative.   Neurological: Negative.   Hematological: Negative.   Psychiatric/Behavioral: Negative.     Social History   Socioeconomic History  . Marital status: Widowed    Spouse name: Not on file  . Number of children: 1  . Years of education: post-graduate  . Highest education level: Not on file  Occupational History  . Occupation: retired    Comment: Human resources officer  Social Needs  . Financial resource strain: Not hard at all  . Food insecurity:    Worry: Never true    Inability: Never true  . Transportation needs:    Medical: No    Non-medical: No  Tobacco Use  . Smoking status: Former Smoker    Packs/day: 0.50    Years: 15.00    Pack years: 7.50    Types: Cigarettes    Last attempt to quit: 04/18/1997    Years since quitting: 20.2  .  Smokeless tobacco: Never Used  Substance and Sexual Activity  . Alcohol use: No  . Drug use: No  . Sexual activity: Not Currently  Lifestyle  . Physical activity:    Days per week: 5 days    Minutes per session: 20 min  . Stress: Not at all  Relationships  . Social connections:    Talks on phone: Not on file    Gets together: Not on file    Attends religious service: Not on file    Active member of club or organization: Not on file    Attends meetings of clubs or organizations: Not on file    Relationship status: Not on file  . Intimate partner violence:    Fear of current or ex partner: Not on file    Emotionally abused: Not on file    Physically abused: Not on file    Forced sexual activity: Not on file  Other Topics Concern  . Not on file  Social History Narrative  . Not on file    Past Medical History:  Diagnosis Date  . Allergic rhinitis   . Barrett's esophagus 2017   biopsy confirmed, related to GERD  . GERD (gastroesophageal reflux disease)   . Hyperlipidemia   . Hypertension   . Mitral valve prolapse    followed by cardiology     Patient Active Problem List   Diagnosis Date Noted  . IBS (irritable bowel syndrome) 05/11/2017  . Other  ascites 05/09/2017  . Hypertension   . Hyperlipidemia   . Allergic rhinitis   . GERD (gastroesophageal reflux disease)   . Mitral valve prolapse   . Barrett's esophagus 04/20/2015    Past Surgical History:  Procedure Laterality Date  . BARTHOLIN GLAND CYST EXCISION  1975  . BREAST ENHANCEMENT SURGERY Bilateral 1975  . LAPAROSCOPIC CHOLECYSTECTOMY  2011  . LAPAROSCOPIC OOPHERECTOMY Right 1987   2/2 benign tumor  . TRANSANAL EXCISION OF RECTAL MASS WITH HEMORRHOID INJECTION  1989   hemorrhoid removal  . TUBAL LIGATION  1987    Her family history includes Breast cancer (age of onset: 3968) in her paternal aunt; Colon cancer (age of onset: 3343) in her sister; Congestive Heart Failure (age of onset: 4182) in her maternal  grandmother; Dementia (age of onset: 575) in her mother; Healthy in her paternal grandmother; Heart attack (age of onset: 7366) in her maternal grandfather; Heart disease in her brother and brother; Heart disease (age of onset: 5548) in her mother.      Current Outpatient Medications:  .  aspirin 81 MG chewable tablet, Chew 1 tablet by mouth daily., Disp: , Rfl:  .  cetirizine (ZYRTEC) 10 MG tablet, Take 10 mg by mouth daily., Disp: , Rfl:  .  losartan-hydrochlorothiazide (HYZAAR) 100-25 MG tablet, Take 1 tablet by mouth daily., Disp: 90 tablet, Rfl: 3 .  metoprolol succinate (TOPROL-XL) 100 MG 24 hr tablet, Take 1 tablet (100 mg total) by mouth daily. Take with or immediately following a meal., Disp: 90 tablet, Rfl: 3 .  pantoprazole (PROTONIX) 40 MG tablet, Take 1 tablet by mouth daily., Disp: , Rfl:  .  pravastatin (PRAVACHOL) 10 MG tablet, Take 1 tablet (10 mg total) by mouth daily., Disp: 90 tablet, Rfl: 3 .  PREBIOTIC PRODUCT PO, Take by mouth., Disp: , Rfl:   Patient Care Team: Erasmo DownerBacigalupo, Cayleb Jarnigan M, MD as PCP - General (Family Medicine) Toney ReilVanga, Rohini Reddy, MD as Consulting Physician (Gastroenterology)     Objective:   Vitals: BP 140/74 (BP Location: Left Arm, Patient Position: Sitting, Cuff Size: Normal)   Pulse 68   Temp 97.9 F (36.6 C) (Oral)   Resp 16   Ht 5\' 7"  (1.702 m)   Wt 187 lb (84.8 kg)   BMI 29.29 kg/m   Physical Exam  Constitutional: She is oriented to person, place, and time. She appears well-developed and well-nourished. No distress.  HENT:  Head: Normocephalic and atraumatic.  Right Ear: External ear normal.  Left Ear: External ear normal.  Nose: Nose normal.  Mouth/Throat: Oropharynx is clear and moist.  Eyes: Pupils are equal, round, and reactive to light. Conjunctivae and EOM are normal. No scleral icterus.  Neck: Neck supple. No thyromegaly present.  Cardiovascular: Normal rate, regular rhythm, normal heart sounds and intact distal pulses.  No murmur  heard. Pulmonary/Chest: Effort normal and breath sounds normal. No respiratory distress. She has no wheezes. She has no rales.  Abdominal: Soft. Bowel sounds are normal. She exhibits no distension. There is no tenderness. There is no rebound and no guarding.  Genitourinary:  Genitourinary Comments: Breasts: breasts appear normal, no suspicious masses, no skin or nipple changes or axillary nodes, bilateral implants, no palpable abnormalities otherwise. L nipple inverted (chronic)  Musculoskeletal: She exhibits no edema or deformity.  Lymphadenopathy:    She has no cervical adenopathy.  Neurological: She is alert and oriented to person, place, and time.  Skin: Skin is warm and dry. No rash noted.  Psychiatric: She  has a normal mood and affect. Her behavior is normal.  Vitals reviewed.   Activities of Daily Living In your present state of health, do you have any difficulty performing the following activities: 06/06/2017 01/13/2017  Hearing? N N  Vision? 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 Still can see out of left eye, just blurred. -  Difficulty concentrating or making decisions? N N  Walking or climbing stairs? N N  Dressing or bathing? N N  Doing errands, shopping? N N  Preparing Food and eating ? N -  Using the Toilet? N -  In the past six months, have you accidently leaked urine? N -  Do you have problems with loss of bowel control? N -  Managing your Medications? N -  Managing your Finances? N -  Housekeeping or managing your Housekeeping? N -  Some recent data might be hidden    Fall Risk Assessment Fall Risk  06/06/2017 01/13/2017  Falls in the past year? No No     Depression Screen PHQ 2/9 Scores 06/06/2017 01/13/2017  PHQ - 2 Score 0 0   Pt declined 6CIT screening.  Assessment & Plan:    Annual Physical Reviewed patient's Family Medical History Reviewed and updated list of patient's medical providers Assessment of cognitive impairment was done Assessed  patient's functional ability Established a written schedule for health screening services Health Risk Assessent Completed and Reviewed  Exercise Activities and Dietary recommendations Goals    . Exercise 3x per week (30 min per time)     [facility] and exercise 5 days a week for 30 minutes or more.        Immunization History  Administered Date(s) Administered  . Influenza, High Dose Seasonal PF 01/13/2017  . Pneumococcal Conjugate-13 06/07/2014  . Pneumococcal Polysaccharide-23 06/23/2015  . Tdap 03/17/2016    Health Maintenance  Topic Date Due  . MAMMOGRAM  08/29/1998  . DEXA SCAN  08/28/2013  . COLONOSCOPY  09/04/2025  . TETANUS/TDAP  03/17/2026  . INFLUENZA VACCINE  Completed  . Hepatitis C Screening  Completed  . PNA vac Low Risk Adult  Completed     Discussed health benefits of physical activity, and encouraged her to engage in regular exercise appropriate for her age and condition.    ------------------------------------------------------------------------------------------------------------ Problem List Items Addressed This Visit      Cardiovascular and Mediastinum   Hypertension    Elevated today due to white coat hypertension Well controlled at home Discussed diet and exercise Continue current meds       Other Visit Diagnoses    Encounter for annual physical exam    -  Primary       Return in about 1 year (around 07/14/2018) for physical.   The entirety of the information documented in the History of Present Illness, Review of Systems and Physical Exam were personally obtained by me. Portions of this information were initially documented by Irving Burton Ratchford, CMA and reviewed by me for thoroughness and accuracy.    Erasmo Downer, MD, MPH [facility] 07/13/2017 2:59 PM

## 2017-07-13 NOTE — Assessment & Plan Note (Signed)
Elevated today due to white coat hypertension Well controlled at home Discussed diet and exercise Continue current meds

## 2017-10-17 ENCOUNTER — Other Ambulatory Visit: Payer: Self-pay

## 2017-10-17 DIAGNOSIS — K219 Gastro-esophageal reflux disease without esophagitis: Secondary | ICD-10-CM

## 2017-10-17 MED ORDER — PANTOPRAZOLE SODIUM 40 MG PO TBEC: 40.0000 mg | DELAYED_RELEASE_TABLET | Freq: Every day | ORAL | 0 refills | Status: DC

## 2017-10-17 NOTE — Telephone Encounter (Signed)
This is a Dr.B patient. [facility] pantoprazole (PROTONIX) 40 MG tablet   90-day supply.

## 2017-11-15 ENCOUNTER — Encounter: Payer: Self-pay | Admitting: Family Medicine

## 2017-11-15 ENCOUNTER — Ambulatory Visit (INDEPENDENT_AMBULATORY_CARE_PROVIDER_SITE_OTHER): Payer: Medicare Other | Admitting: Family Medicine

## 2017-11-15 VITALS — BP 148/78 | HR 84 | Temp 98.0°F | Resp 16 | Wt 184.0 lb

## 2017-11-15 DIAGNOSIS — R197 Diarrhea, unspecified: Secondary | ICD-10-CM | POA: Diagnosis not present

## 2017-11-15 MED ORDER — ONDANSETRON HCL 4 MG PO TABS: 4.0000 mg | ORAL_TABLET | Freq: Three times a day (TID) | ORAL | 0 refills | Status: DC | PRN

## 2017-11-15 NOTE — Patient Instructions (Signed)
Diarrhea, Adult Diarrhea is frequent loose and watery bowel movements. Diarrhea can make you feel weak and cause you to become dehydrated. Dehydration can make you tired and thirsty, cause you to have a dry mouth, and decrease how often you urinate. Diarrhea typically lasts 2-3 days. However, it can last longer if it is a sign of something more serious. It is important to treat your diarrhea as told by your health care provider. Follow these instructions at home: Eating and drinking  Follow these recommendations as told by your health care provider:  Take an oral rehydration solution (ORS). This is a drink that is sold at pharmacies and retail stores.  Drink clear fluids, such as water, ice chips, diluted fruit juice, and low-calorie sports drinks.  Eat bland, easy-to-digest foods in small amounts as you are able. These foods include bananas, applesauce, rice, lean meats, toast, and crackers.  Avoid drinking fluids that contain a lot of sugar or caffeine, such as energy drinks, sports drinks, and soda.  Avoid alcohol.  Avoid spicy or fatty foods.  General instructions  Drink enough fluid to keep your urine clear or pale yellow.  Wash your hands often. If soap and water are not available, use hand sanitizer.  Make sure that all people in your household wash their hands well and often.  Take over-the-counter and prescription medicines only as told by your health care provider.  Rest at home while you recover.  Watch your condition for any changes.  Take a warm bath to relieve any burning or pain from frequent diarrhea episodes.  Keep all follow-up visits as told by your health care provider. This is important. Contact a health care provider if:  You have a fever.  Your diarrhea gets worse.  You have new symptoms.  You cannot keep fluids down.  You feel light-headed or dizzy.  You have a headache  You have muscle cramps. Get help right away if:  You have chest  pain.  You feel extremely weak or you faint.  You have bloody or black stools or stools that look like tar.  You have severe pain, cramping, or bloating in your abdomen.  You have trouble breathing or you are breathing very quickly.  Your heart is beating very quickly.  Your skin feels cold and clammy.  You feel confused.  You have signs of dehydration, such as: ? Dark urine, very little urine, or no urine. ? Cracked lips. ? Dry mouth. ? Sunken eyes. ? Sleepiness. ? Weakness. This information is not intended to replace advice given to you by your health care provider. Make sure you discuss any questions you have with your health care provider. Document Released: 03/26/2002 Document Revised: 08/14/2015 Document Reviewed: 12/10/2014 Elsevier Interactive Patient Education  2018 Elsevier Inc.  

## 2017-11-15 NOTE — Progress Notes (Signed)
Patient: Kara French Female    DOB: [birth year]   67 y.o.   MRN: [id number] Visit Date: 11/15/2017  Today's Provider: Shirlee LatchAngela Bacigalupo, MD     I, Joslyn HyEmily Ratchford, CMA, am acting as scribe for Shirlee LatchAngela Bacigalupo, MD.  Chief Complaint  Patient presents with  . Diarrhea   Subjective:    Diarrhea   This is a new problem. Episode onset: x 5 days. The problem has been gradually improving. Diarrhea characteristics: loose stools. Associated symptoms include abdominal pain, bloating, headaches, increased flatus and sweats. Pertinent negatives include no arthralgias, chills, coughing, fever, myalgias or vomiting. Associated symptoms comments: Pt is also c/o nausea and fatigue. Risk factors include suspect food intake (pt states she ate [place] lunch meat that has been opended for 7 days. She is concerned about possible listeria). She has tried bismuth subsalicylate for the symptoms. The treatment provided mild relief.   5-6 times daily of diarrhea.  Has IBS but usually doesn't feel bad like she does currently.  No known sick contacts.     Allergies  Allergen Reactions  . Aller-Chlor  [Chlorpheniramine]   . Benzalkonium Chloride Other (See Comments)    BLISTERS  . Neomycin-Bacitracin Zn-Polymyx   . Tape   . Decongestant [Pseudoephedrine Hcl Er] Itching  . Iodinated Diagnostic Agents Rash and Hives     Current Outpatient Medications:  .  aspirin 81 MG chewable tablet, Chew 1 tablet by mouth daily., Disp: , Rfl:  .  cetirizine (ZYRTEC) 10 MG tablet, Take 10 mg by mouth daily., Disp: , Rfl:  .  losartan-hydrochlorothiazide (HYZAAR) 100-25 MG tablet, Take 1 tablet by mouth daily., Disp: 90 tablet, Rfl: 3 .  metoprolol succinate (TOPROL-XL) 100 MG 24 hr tablet, Take 1 tablet (100 mg total) by mouth daily. Take with or immediately following a meal., Disp: 90 tablet, Rfl: 3 .  pantoprazole (PROTONIX) 40 MG tablet, Take 1 tablet (40 mg total) by mouth daily., Disp: 90 tablet, Rfl: 0 .   pravastatin (PRAVACHOL) 10 MG tablet, Take 1 tablet (10 mg total) by mouth daily., Disp: 90 tablet, Rfl: 3 .  PREBIOTIC PRODUCT PO, Take by mouth., Disp: , Rfl:   Review of Systems  Constitutional: Positive for fatigue. Negative for chills and fever.  Respiratory: Negative for cough.   Gastrointestinal: Positive for abdominal pain, bloating, diarrhea, flatus and nausea. Negative for vomiting.  Musculoskeletal: Negative for arthralgias and myalgias.  Neurological: Positive for headaches.    Social History   Tobacco Use  . Smoking status: Former Smoker    Packs/day: 0.50    Years: 15.00    Pack years: 7.50    Types: Cigarettes    Last attempt to quit: 04/18/1997    Years since quitting: 20.5  . Smokeless tobacco: Never Used  Substance Use Topics  . Alcohol use: No   Objective:   BP (!) 148/78 (BP Location: Left Arm, Patient Position: Sitting, Cuff Size: Normal)   Pulse 84   Temp 98 F (36.7 C) (Oral)   Resp 16   Wt 184 lb (83.5 kg)   SpO2 99%   BMI 28.82 kg/m  Vitals:   11/15/17 1014  BP: (!) 148/78  Pulse: 84  Resp: 16  Temp: 98 F (36.7 C)  TempSrc: Oral  SpO2: 99%  Weight: 184 lb (83.5 kg)     Physical Exam  Constitutional: She is oriented to person, place, and time. She appears well-developed and well-nourished. No distress.  HENT:  Head:  Normocephalic and atraumatic.  Eyes: Conjunctivae are normal. No scleral icterus.  Neck: Neck supple. No thyromegaly present.  Cardiovascular: Normal rate, regular rhythm, normal heart sounds and intact distal pulses.  No murmur heard. Pulmonary/Chest: Effort normal and breath sounds normal. No respiratory distress. She has no wheezes. She has no rales.  Abdominal: Soft. Bowel sounds are normal. She exhibits distension. There is no tenderness. There is no rebound and no guarding.  Musculoskeletal: She exhibits no edema.  Lymphadenopathy:    She has no cervical adenopathy.  Neurological: She is alert and oriented to  person, place, and time.  Skin: Skin is warm and dry. Capillary refill takes less than 2 seconds. No rash noted.  Psychiatric: She has a normal mood and affect. Her behavior is normal.  Vitals reviewed.       Assessment & Plan:   1. Diarrhea of presumed infectious origin - benign abd exam today, but worsening diarrhea and nausea x5 days that is different from typical IBS discomfort - Avoid anti-diarrheal while working up for infectious process - ok to use Zofran prn for nausea - discussed that a possible side effect is diarrhea though - will send stool culture and GI pathogen panel PCR to see if any bacterial cause - will treat as indicated by results - GI Profile, Stool, PCR - Stool Culture    Meds ordered this encounter  Medications  . ondansetron (ZOFRAN) 4 MG tablet    Sig: Take 1 tablet (4 mg total) by mouth every 8 (eight) hours as needed for nausea or vomiting.    Dispense:  45 tablet    Refill:  0     Return if symptoms worsen or fail to improve.   The entirety of the information documented in the History of Present Illness, Review of Systems and Physical Exam were personally obtained by me. Portions of this information were initially documented by Irving Burton Ratchford, CMA and reviewed by me for thoroughness and accuracy.    Erasmo Downer, MD, MPH [facility] 11/15/2017 10:55 AM

## 2017-11-18 ENCOUNTER — Telehealth: Payer: Self-pay

## 2017-11-18 LAB — GI PROFILE, STOOL, PCR
Adenovirus F 40/41: NOT DETECTED
Astrovirus: NOT DETECTED
C difficile toxin A/B: NOT DETECTED
CRYPTOSPORIDIUM: NOT DETECTED
CYCLOSPORA CAYETANENSIS: NOT DETECTED
Campylobacter: NOT DETECTED
ENTEROAGGREGATIVE E COLI: NOT DETECTED
Entamoeba histolytica: NOT DETECTED
Enteropathogenic E coli: NOT DETECTED
Enterotoxigenic E coli: NOT DETECTED
Giardia lamblia: NOT DETECTED
NOROVIRUS GI/GII: NOT DETECTED
Plesiomonas shigelloides: NOT DETECTED
Rotavirus A: NOT DETECTED
SALMONELLA: NOT DETECTED
SHIGA-TOXIN-PRODUCING E COLI: NOT DETECTED
SHIGELLA/ENTEROINVASIVE E COLI: NOT DETECTED
Sapovirus: DETECTED — AB
Vibrio cholerae: NOT DETECTED
Vibrio: NOT DETECTED
YERSINIA ENTEROCOLITICA: NOT DETECTED

## 2017-11-18 NOTE — Telephone Encounter (Signed)
Pt advised. States she is feeling better. 

## 2017-11-18 NOTE — Telephone Encounter (Signed)
Pt returning call

## 2017-11-18 NOTE — Telephone Encounter (Signed)
-----   Message from Erasmo DownerAngela M Bacigalupo, MD sent at 11/18/2017  8:54 AM EDT ----- Stool culture negative so far.  Other test negative for everything except sapovirus, which is a GI bug that is from a virus.  Does not require treatment.  Good handwashing prevents the spread.  It is typically less contagious than norovirus though.  Erasmo DownerBacigalupo, Angela M, MD, MPH [facility] 11/18/2017 8:54 AM

## 2017-11-18 NOTE — Telephone Encounter (Signed)
lmtcb

## 2017-11-20 LAB — STOOL CULTURE: E COLI SHIGA TOXIN ASSAY: NEGATIVE

## 2018-01-23 ENCOUNTER — Other Ambulatory Visit: Payer: Self-pay | Admitting: Family Medicine

## 2018-01-23 DIAGNOSIS — K219 Gastro-esophageal reflux disease without esophagitis: Secondary | ICD-10-CM

## 2018-01-23 MED ORDER — PANTOPRAZOLE SODIUM 40 MG PO TBEC: 40.0000 mg | DELAYED_RELEASE_TABLET | Freq: Every day | ORAL | 3 refills | Status: DC

## 2018-01-23 NOTE — Telephone Encounter (Signed)
Pt needs refill on   Pantoprazole 40 mg  [facility]  thanks [employer]

## 2018-02-14 ENCOUNTER — Ambulatory Visit (INDEPENDENT_AMBULATORY_CARE_PROVIDER_SITE_OTHER): Payer: Medicare Other | Admitting: Physician Assistant

## 2018-02-14 DIAGNOSIS — Z23 Encounter for immunization: Secondary | ICD-10-CM

## 2018-03-07 ENCOUNTER — Ambulatory Visit (INDEPENDENT_AMBULATORY_CARE_PROVIDER_SITE_OTHER): Payer: Medicare Other | Admitting: Physician Assistant

## 2018-03-07 ENCOUNTER — Encounter: Payer: Self-pay | Admitting: Physician Assistant

## 2018-03-07 VITALS — BP 144/90 | HR 78 | Temp 97.9°F | Wt 188.4 lb

## 2018-03-07 DIAGNOSIS — R05 Cough: Secondary | ICD-10-CM | POA: Diagnosis not present

## 2018-03-07 DIAGNOSIS — R059 Cough, unspecified: Secondary | ICD-10-CM

## 2018-03-07 MED ORDER — BENZONATATE 100 MG PO CAPS: 100.0000 mg | ORAL_CAPSULE | Freq: Three times a day (TID) | ORAL | 0 refills | Status: AC | PRN

## 2018-03-07 NOTE — Progress Notes (Signed)
Patient: Kara French Female    DOB: [birth year]   67 y.o.   MRN: [id number] Visit Date: 03/07/2018  Today's Provider: Trey SailorsAdriana M Pollak, PA-C   Chief Complaint  Patient presents with  . Sinusitis   Subjective:    Sinusitis  This is a new problem. Episode onset: 10 days ago. The problem has been gradually improving since onset. There has been no fever. Associated symptoms include congestion, coughing, headaches, a hoarse voice, sinus pressure and a sore throat. Pertinent negatives include no shortness of breath. Ear pain: ear fullness. Treatments tried: Excedrin, Nasal Decongesant, Mucinex, and Delsym. The treatment provided mild relief.  Reports she is most bothered by the cough.      Allergies  Allergen Reactions  . Aller-Chlor  [Chlorpheniramine]   . Benzalkonium Chloride Other (See Comments)    BLISTERS  . Neomycin-Bacitracin Zn-Polymyx   . Tape   . Decongestant [Pseudoephedrine Hcl Er] Itching  . Iodinated Diagnostic Agents Rash and Hives     Current Outpatient Medications:  .  aspirin 81 MG chewable tablet, Chew 1 tablet by mouth daily., Disp: , Rfl:  .  cetirizine (ZYRTEC) 10 MG tablet, Take 10 mg by mouth daily., Disp: , Rfl:  .  losartan-hydrochlorothiazide (HYZAAR) 100-25 MG tablet, Take 1 tablet by mouth daily., Disp: 90 tablet, Rfl: 3 .  metoprolol succinate (TOPROL-XL) 100 MG 24 hr tablet, Take 1 tablet (100 mg total) by mouth daily. Take with or immediately following a meal., Disp: 90 tablet, Rfl: 3 .  ondansetron (ZOFRAN) 4 MG tablet, Take 1 tablet (4 mg total) by mouth every 8 (eight) hours as needed for nausea or vomiting., Disp: 45 tablet, Rfl: 0 .  pantoprazole (PROTONIX) 40 MG tablet, Take 1 tablet (40 mg total) by mouth daily., Disp: 90 tablet, Rfl: 3 .  pravastatin (PRAVACHOL) 10 MG tablet, Take 1 tablet (10 mg total) by mouth daily., Disp: 90 tablet, Rfl: 3 .  PREBIOTIC PRODUCT PO, Take by mouth., Disp: , Rfl:    Review of Systems    Constitutional: Negative.   HENT: Positive for congestion, hoarse voice, sinus pressure and sore throat. Ear pain: ear fullness.   Respiratory: Positive for cough. Negative for shortness of breath.   Cardiovascular: Negative.   Musculoskeletal: Negative.   Neurological: Positive for headaches.    Social History   Tobacco Use  . Smoking status: Former Smoker    Packs/day: 0.50    Years: 15.00    Pack years: 7.50    Types: Cigarettes    Last attempt to quit: 04/18/1997    Years since quitting: 20.8  . Smokeless tobacco: Never Used  Substance Use Topics  . Alcohol use: No   Objective:   BP (!) 144/90 (BP Location: Left Arm, Patient Position: Sitting, Cuff Size: Normal)   Pulse 78   Temp 97.9 F (36.6 C) (Oral)   Wt 188 lb 6.4 oz (85.5 kg)   SpO2 99%   BMI 29.51 kg/m  Vitals:   03/07/18 1536  BP: (!) 144/90  Pulse: 78  Temp: 97.9 F (36.6 C)  TempSrc: Oral  SpO2: 99%  Weight: 188 lb 6.4 oz (85.5 kg)     Physical Exam  Constitutional: She appears well-developed and well-nourished.  HENT:  Right Ear: External ear normal.  Left Ear: External ear normal.  Mouth/Throat: Oropharynx is clear and moist. No oropharyngeal exudate.  Eyes: Right eye exhibits discharge. Left eye exhibits discharge.  Neck: Neck supple.  Cardiovascular:  Normal rate and regular rhythm.  Pulmonary/Chest: Effort normal and breath sounds normal. No respiratory distress. She has no rales.  Lymphadenopathy:    She has no cervical adenopathy.  Skin: Skin is warm and dry.  Psychiatric: She has a normal mood and affect. Her behavior is normal.        Assessment & Plan:     1. Cough  Appears most consistent with post infectious cough rather than sinusitis. Cough medication as below, drink plenty of fluids.  - benzonatate (TESSALON PERLES) 100 MG capsule; Take 1 capsule (100 mg total) by mouth 3 (three) times daily as needed for up to 7 days.  Dispense: 21 capsule; Refill: 0  Return if  symptoms worsen or fail to improve.  The entirety of the information documented in the History of Present Illness, Review of Systems and Physical Exam were personally obtained by me. Portions of this information were initially documented by Rondel Baton, CMA and reviewed by me for thoroughness and accuracy.        Trey Sailors, PA-C  [facility]

## 2018-03-07 NOTE — Patient Instructions (Signed)

## 2018-05-03 IMAGING — CR DG CHEST 2V
1 series · 2 of 2 positions shown · non-contrast
Comparison: none

CLINICAL DATA: Pt states she has had fluid retention in abdomen for
the last 3-4 days with SOB. History of mitral valve prolapse, HTN.
shielded

EXAM:
CHEST - 2 VIEW

[Series 1: dg chest 2 view · 0.14mm/px · 2 of 2 slices shown]
[im 1/2]
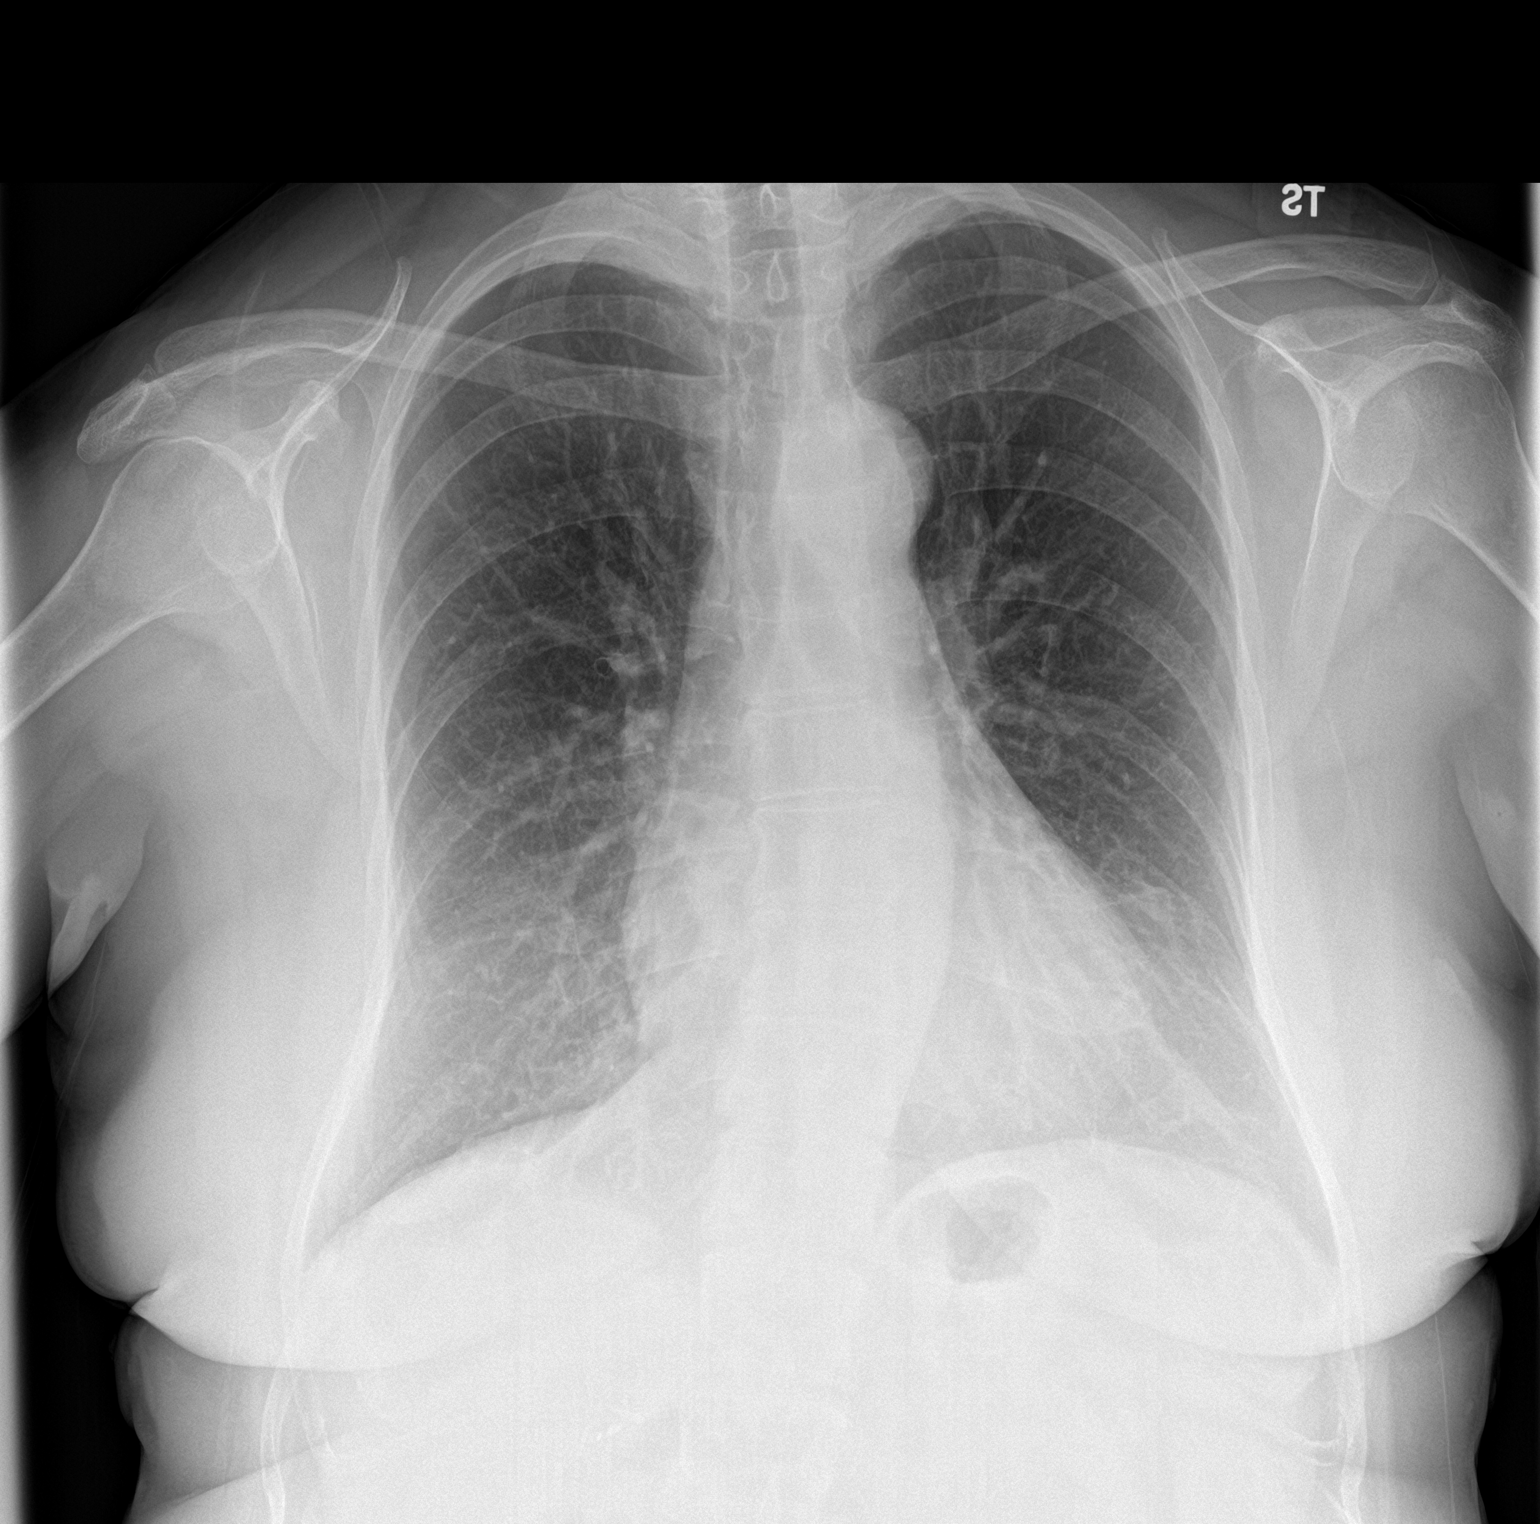
[im 2/2]
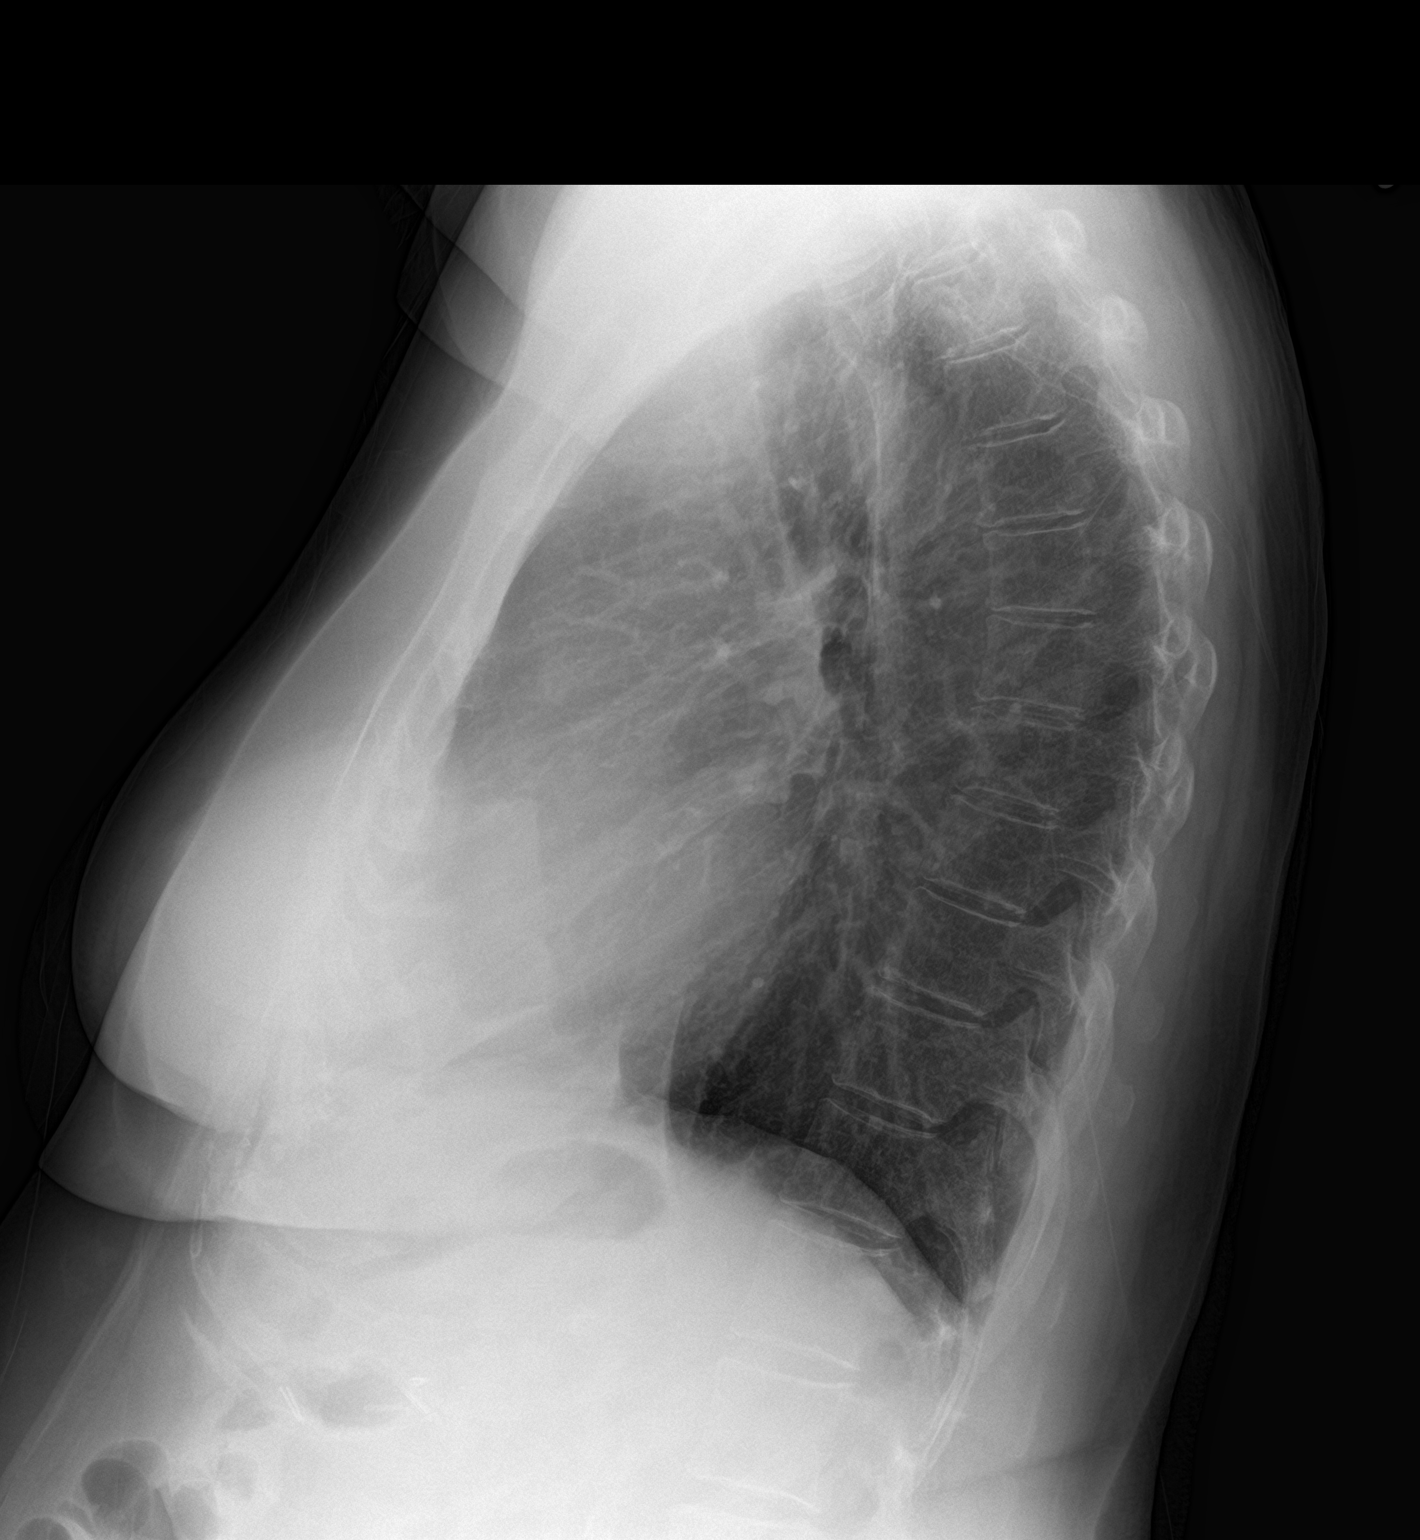

[2 of 2 positions shown; findings below may reference images not displayed]

FINDINGS: Coarse bibasilar interstitial markings. No confluent airspace
disease or overt edema.

Heart size upper limits normal.

No effusion.

Visualized bones unremarkable.  Cholecystectomy clips.
IMPRESSION: No acute cardiopulmonary disease.

## 2018-06-07 ENCOUNTER — Ambulatory Visit: Payer: Self-pay

## 2018-07-06 ENCOUNTER — Ambulatory Visit: Payer: Medicare Other

## 2018-07-10 ENCOUNTER — Telehealth: Payer: Self-pay | Admitting: Family Medicine

## 2018-07-17 ENCOUNTER — Encounter: Payer: Self-pay | Admitting: Family Medicine

## 2018-07-18 ENCOUNTER — Other Ambulatory Visit: Payer: Self-pay

## 2018-07-18 ENCOUNTER — Ambulatory Visit (INDEPENDENT_AMBULATORY_CARE_PROVIDER_SITE_OTHER): Payer: Medicare Other

## 2018-07-18 DIAGNOSIS — Z0000 Encounter for general adult medical examination without abnormal findings: Secondary | ICD-10-CM | POA: Diagnosis not present

## 2018-07-18 NOTE — Patient Instructions (Signed)
Kara French , Thank you for taking time to come for your Medicare Wellness Visit. I appreciate your ongoing commitment to your health goals. Please review the following plan we discussed and let me know if I can assist you in the future.   Screening recommendations/referrals: Colonoscopy: Up to date, due 08/2020 Mammogram: Pt declines order today.  Bone Density: Pt declines referral today.  Recommended yearly ophthalmology/optometry visit for glaucoma screening and checkup Recommended yearly dental visit for hygiene and checkup  Vaccinations: Influenza vaccine: Up to date Pneumococcal vaccine: Completed series Tdap vaccine: Up to date, due 02/2026 Shingles vaccine: Pt declines today.     Advanced directives: Advance directive discussed with you today. Even though you declined this today please call our office should you change your mind and we can give you the proper paperwork for you to fill out.  Conditions/risks identified: Continue to work towards exercising 3 days a week for at least minutes at a time.   Next appointment: 11/22/18 @ 2:00 PM with Dr Beryle Flock.   Preventive Care 70 Years and Older, Female Preventive care refers to lifestyle choices and visits with your health care provider that can promote health and wellness. What does preventive care include?  A yearly physical exam. This is also called an annual well check.  Dental exams once or twice a year.  Routine eye exams. Ask your health care provider how often you should have your eyes checked.  Personal lifestyle choices, including:  Daily care of your teeth and gums.  Regular physical activity.  Eating a healthy diet.  Avoiding tobacco and drug use.  Limiting alcohol use.  Practicing safe sex.  Taking low-dose aspirin every day.  Taking vitamin and mineral supplements as recommended by your health care provider. What happens during an annual well check? The services and screenings done by your health  care provider during your annual well check will depend on your age, overall health, lifestyle risk factors, and family history of disease. Counseling  Your health care provider may ask you questions about your:  Alcohol use.  Tobacco use.  Drug use.  Emotional well-being.  Home and relationship well-being.  Sexual activity.  Eating habits.  History of falls.  Memory and ability to understand (cognition).  Work and work Astronomer.  Reproductive health. Screening  You may have the following tests or measurements:  Height, weight, and BMI.  Blood pressure.  Lipid and cholesterol levels. These may be checked every 5 years, or more frequently if you are over 34 years old.  Skin check.  Lung cancer screening. You may have this screening every year starting at age 60 if you have a 30-pack-year history of smoking and currently smoke or have quit within the past 15 years.  Fecal occult blood test (FOBT) of the stool. You may have this test every year starting at age 27.  Flexible sigmoidoscopy or colonoscopy. You may have a sigmoidoscopy every 5 years or a colonoscopy every 10 years starting at age 25.  Hepatitis C blood test.  Hepatitis B blood test.  Sexually transmitted disease (STD) testing.  Diabetes screening. This is done by checking your blood sugar (glucose) after you have not eaten for a while (fasting). You may have this done every 1-3 years.  Bone density scan. This is done to screen for osteoporosis. You may have this done starting at age 65.  Mammogram. This may be done every 1-2 years. Talk to your health care provider about how often you should have  regular mammograms. Talk with your health care provider about your test results, treatment options, and if necessary, the need for more tests. Vaccines  Your health care provider may recommend certain vaccines, such as:  Influenza vaccine. This is recommended every year.  Tetanus, diphtheria, and  acellular pertussis (Tdap, Td) vaccine. You may need a Td booster every 10 years.  Zoster vaccine. You may need this after age 75.  Pneumococcal 13-valent conjugate (PCV13) vaccine. One dose is recommended after age 12.  Pneumococcal polysaccharide (PPSV23) vaccine. One dose is recommended after age 50. Talk to your health care provider about which screenings and vaccines you need and how often you need them. This information is not intended to replace advice given to you by your health care provider. Make sure you discuss any questions you have with your health care provider. Document Released: 05/02/2015 Document Revised: 12/24/2015 Document Reviewed: 02/04/2015 Elsevier Interactive Patient Education  2017 [place] Prevention in the Home Falls can cause injuries. They can happen to people of all ages. There are many things you can do to make your home safe and to help prevent falls. What can I do on the outside of my home?  Regularly fix the edges of walkways and driveways and fix any cracks.  Remove anything that might make you trip as you walk through a door, such as a raised step or threshold.  Trim any bushes or trees on the path to your home.  Use bright outdoor lighting.  Clear any walking paths of anything that might make someone trip, such as rocks or tools.  Regularly check to see if handrails are loose or broken. Make sure that both sides of any steps have handrails.  Any raised decks and porches should have guardrails on the edges.  Have any leaves, snow, or ice cleared regularly.  Use sand or salt on walking paths during winter.  Clean up any spills in your garage right away. This includes oil or grease spills. What can I do in the bathroom?  Use night lights.  Install grab bars by the toilet and in the tub and shower. Do not use towel bars as grab bars.  Use non-skid mats or decals in the tub or shower.  If you need to sit down in the shower, use  a plastic, non-slip stool.  Keep the floor dry. Clean up any water that spills on the floor as soon as it happens.  Remove soap buildup in the tub or shower regularly.  Attach bath mats securely with double-sided non-slip rug tape.  Do not have throw rugs and other things on the floor that can make you trip. What can I do in the bedroom?  Use night lights.  Make sure that you have a light by your bed that is easy to reach.  Do not use any sheets or blankets that are too big for your bed. They should not hang down onto the floor.  Have a firm chair that has side arms. You can use this for support while you get dressed.  Do not have throw rugs and other things on the floor that can make you trip. What can I do in the kitchen?  Clean up any spills right away.  Avoid walking on wet floors.  Keep items that you use a lot in easy-to-reach places.  If you need to reach something above you, use a strong step stool that has a grab bar.  Keep electrical cords out of the  way.  Do not use floor polish or wax that makes floors slippery. If you must use wax, use non-skid floor wax.  Do not have throw rugs and other things on the floor that can make you trip. What can I do with my stairs?  Do not leave any items on the stairs.  Make sure that there are handrails on both sides of the stairs and use them. Fix handrails that are broken or loose. Make sure that handrails are as long as the stairways.  Check any carpeting to make sure that it is firmly attached to the stairs. Fix any carpet that is loose or worn.  Avoid having throw rugs at the top or bottom of the stairs. If you do have throw rugs, attach them to the floor with carpet tape.  Make sure that you have a light switch at the top of the stairs and the bottom of the stairs. If you do not have them, ask someone to add them for you. What else can I do to help prevent falls?  Wear shoes that:  Do not have high heels.  Have  rubber bottoms.  Are comfortable and fit you well.  Are closed at the toe. Do not wear sandals.  If you use a stepladder:  Make sure that it is fully opened. Do not climb a closed stepladder.  Make sure that both sides of the stepladder are locked into place.  Ask someone to hold it for you, if possible.  Clearly mark and make sure that you can see:  Any grab bars or handrails.  First and last steps.  Where the edge of each step is.  Use tools that help you move around (mobility aids) if they are needed. These include:  Canes.  Walkers.  Scooters.  Crutches.  Turn on the lights when you go into a dark area. Replace any light bulbs as soon as they burn out.  Set up your furniture so you have a clear path. Avoid moving your furniture around.  If any of your floors are uneven, fix them.  If there are any pets around you, be aware of where they are.  Review your medicines with your doctor. Some medicines can make you feel dizzy. This can increase your chance of falling. Ask your doctor what other things that you can do to help prevent falls. This information is not intended to replace advice given to you by your health care provider. Make sure you discuss any questions you have with your health care provider. Document Released: 01/30/2009 Document Revised: 09/11/2015 Document Reviewed: 05/10/2014 Elsevier Interactive Patient Education  2017 [employer].

## 2018-07-18 NOTE — Progress Notes (Signed)
Subjective:   Kara French is a 68 y.o. female who presents for Medicare Annual (Subsequent) preventive examination.  This visit is being conducted via video due to the COVID-19 pandemic. This patient has given me verbal consent via telephone to conduct this visit. Some vital signs may be absent or patient reported.  Patient identification: identified by name, DOB, and current address.    Review of Systems:  N/A  Cardiac Risk Factors include: advanced age (>[id number], 73>65 women);dyslipidemia;hypertension     Objective:     Vitals: There were no vitals taken for this visit.  There is no height or weight on file to calculate BMI. Visit was completed via video. Unable to retrieve vitals today.   Advanced Directives 07/18/2018 06/06/2017 01/13/2017 06/13/2016  Does Patient Have a Medical Advance Directive? No No No No  Would patient like information on creating a medical advance directive? No - Patient declined No - Patient declined - No - Patient declined    Tobacco Social History   Tobacco Use  Smoking Status Former Smoker  . Packs/day: 0.50  . Years: 15.00  . Pack years: 7.50  . Types: Cigarettes  . Last attempt to quit: 04/18/1997  . Years since quitting: 21.2  Smokeless Tobacco Never Used     Counseling given: Not Answered   Clinical Intake:  Pre-visit preparation completed: Yes  Pain : No/denies pain     Nutritional Status: BMI 25 -29 Overweight Nutritional Risks: None Diabetes: No  How often do you need to have someone help you when you read instructions, pamphlets, or other written materials from your doctor or pharmacy?: 1 - Never  Interpreter Needed?: No  Information entered by :: [facility] LLCMmarkoski, LPN  Past Medical History:  Diagnosis Date  . Allergic rhinitis   . Barrett's esophagus 2017   biopsy confirmed, related to GERD  . GERD (gastroesophageal reflux disease)   . Hyperlipidemia   . Hypertension   . Mitral valve prolapse    followed by cardiology    Past Surgical History:  Procedure Laterality Date  . BARTHOLIN GLAND CYST EXCISION  1975  . BREAST ENHANCEMENT SURGERY Bilateral 1975  . LAPAROSCOPIC CHOLECYSTECTOMY  2011  . LAPAROSCOPIC OOPHERECTOMY Right 1987   2/2 benign tumor  . TRANSANAL EXCISION OF RECTAL MASS WITH HEMORRHOID INJECTION  1989   hemorrhoid removal  . TUBAL LIGATION  1987   Family History  Problem Relation Age of Onset  . Dementia Mother 7575  . Heart disease Mother 2848       s/p 7 stents and CABG x3  . Colon cancer Sister 1243  . Heart disease Brother        s./p stent placement  . Heart disease Brother        s/p stent placement  . Congestive Heart Failure Maternal Grandmother 82  . Heart attack Maternal Grandfather 66  . Healthy Paternal Grandmother   . Breast cancer Paternal Aunt 7868   Social History   Socioeconomic History  . Marital status: Widowed    Spouse name: Not on file  . Number of children: 1  . Years of education: post-graduate  . Highest education level: Master's degree (e.g., MA, MS, MEng, MEd, MSW, MBA)  Occupational History  . Occupation: retired    Comment: Human resources officerspeech therapist  Social Needs  . Financial resource strain: Not hard at all  . Food insecurity:    Worry: Never true    Inability: Never true  . Transportation needs:    Medical:  No    Non-medical: No  Tobacco Use  . Smoking status: Former Smoker    Packs/day: 0.50    Years: 15.00    Pack years: 7.50    Types: Cigarettes    Last attempt to quit: 04/18/1997    Years since quitting: 21.2  . Smokeless tobacco: Never Used  Substance and Sexual Activity  . Alcohol use: No  . Drug use: No  . Sexual activity: Not Currently  Lifestyle  . Physical activity:    Days per week: 0 days    Minutes per session: 0 min  . Stress: Not at all  Relationships  . Social connections:    Talks on phone: Patient refused    Gets together: Patient refused    Attends religious service: Patient refused    Active member of club or  organization: Patient refused    Attends meetings of clubs or organizations: Patient refused    Relationship status: Patient refused  Other Topics Concern  . Not on file  Social History Narrative  . Not on file    Outpatient Encounter Medications as of 07/18/2018  Medication Sig  . aspirin 81 MG chewable tablet Chew 1 tablet by mouth daily.  . cetirizine (ZYRTEC) 10 MG tablet Take 10 mg by mouth daily.  Marland Kitchen losartan-hydrochlorothiazide (HYZAAR) 100-25 MG tablet Take 1 tablet by mouth daily.  . metoprolol succinate (TOPROL-XL) 100 MG 24 hr tablet Take 1 tablet (100 mg total) by mouth daily. Take with or immediately following a meal.  . Olopatadine HCl (PATADAY) 0.2 % SOLN Apply 1 drop to eye daily. Both eyes  . pantoprazole (PROTONIX) 40 MG tablet Take 1 tablet (40 mg total) by mouth daily.  . pravastatin (PRAVACHOL) 10 MG tablet Take 1 tablet (10 mg total) by mouth daily.  Marland Kitchen PREBIOTIC PRODUCT PO Take by mouth daily.   . ondansetron (ZOFRAN) 4 MG tablet Take 1 tablet (4 mg total) by mouth every 8 (eight) hours as needed for nausea or vomiting. (Patient not taking: Reported on 07/18/2018)   No facility-administered encounter medications on file as of 07/18/2018.     Activities of Daily Living In your present state of health, do you have any difficulty performing the following activities: 07/18/2018  Hearing? N  Vision? Y  Comment Due to cataracts- sx scheduled for 07/2018.  Difficulty concentrating or making decisions? N  Walking or climbing stairs? Y  Comment Due to both knee pains.   Dressing or bathing? N  Doing errands, shopping? N  Preparing Food and eating ? N  Using the Toilet? N  In the past six months, have you accidently leaked urine? N  Do you have problems with loss of bowel control? N  Managing your Medications? N  Managing your Finances? N  Housekeeping or managing your Housekeeping? N  Some recent data might be hidden    Patient Care Team: Erasmo Downer, MD  as PCP - General (Family Medicine) Toney Reil, MD as Consulting Physician (Gastroenterology)    Assessment:   This is a routine wellness examination for Opel.  Exercise Activities and Dietary recommendations Current Exercise Habits: The patient does not participate in regular exercise at present, Exercise limited by: orthopedic condition(s)  Goals    . Exercise 3x per week (30 min per time)     [facility] and exercise 5 days a week for 30 minutes or more.        Fall  Risk: Fall Risk  07/18/2018 06/06/2017 01/13/2017  Falls in the past year? 0 No No    FALL RISK PREVENTION PERTAINING TO THE HOME:  Any stairs in or around the home? No  If so, are there any without handrails? N/A  Home free of loose throw rugs in walkways, pet beds, electrical cords, etc? Yes  Adequate lighting in your home to reduce risk of falls? Yes   ASSISTIVE DEVICES UTILIZED TO PREVENT FALLS:  Life alert? No  Use of a cane, walker or w/c? Yes  Grab bars in the bathroom? Yes  Shower chair or bench in shower? Yes  Elevated toilet seat or a handicapped toilet? No    TIMED UP AND GO:  Was the test performed? No .    Depression Screen PHQ 2/9 Scores 07/18/2018 06/06/2017 01/13/2017  PHQ - 2 Score 0 0 0     Cognitive Function: Declined today.         Immunization History  Administered Date(s) Administered  . Influenza, High Dose Seasonal PF 01/13/2017, 02/14/2018  . Pneumococcal Conjugate-13 06/07/2014  . Pneumococcal Polysaccharide-23 06/23/2015  . Tdap 03/17/2016    Qualifies for Shingles Vaccine? Yes . Due for Shingrix. Education has been provided regarding the importance of this vaccine. Pt has been advised to call insurance company to determine out of pocket expense. Advised may also receive vaccine at local pharmacy or Health Dept. Verbalized acceptance and understanding.  Tdap: Up to date  Flu Vaccine: Up to date  Pneumococcal Vaccine:  Up to date  Screening Tests Health Maintenance  Topic Date Due  . MAMMOGRAM  10/30/2016  . DEXA SCAN  10/30/2016  . COLONOSCOPY  09/04/2020  . TETANUS/TDAP  03/17/2026  . INFLUENZA VACCINE  Completed  . Hepatitis C Screening  Completed  . PNA vac Low Risk Adult  Completed    Cancer Screenings:  Colorectal Screening: Completed 09/05/15. Repeat every 5 years.  Mammogram: Completed 10/31/14. Repeat every year. Pt declined order today and states that she has implants and has already spoke with PCP about this and will get order if needed.   Bone Density: Completed 10/31/14. Results reflect OSTEOPOROSIS. Repeat every 2 years. Pt declined order today and states she will request this at a later date.   Lung Cancer Screening: (Low Dose CT Chest recommended if Age 63-80 years, 30 pack-year currently smoking OR have quit w/in 15years.) does not qualify.   Additional Screening:  Hepatitis C Screening: Up to date  Vision Screening: Recommended annual ophthalmology exams for early detection of glaucoma and other disorders of the eye.  Dental Screening: Recommended annual dental exams for proper oral hygiene  Community Resource Referral:  CRR required this visit?  No       Plan:  I have personally reviewed and addressed the Medicare Annual Wellness questionnaire and have noted the following in the patient's chart:  A. Medical and social history B. Use of alcohol, tobacco or illicit drugs  C. Current medications and supplements D. Functional ability and status E.  Nutritional status F.  Physical activity G. Advance directives H. List of other physicians I.  Hospitalizations, surgeries, and ER visits in previous 12 months J.  Vitals K. Screenings such as hearing and vision if needed, cognitive and depression L. Referrals and appointments   In addition, I have reviewed and discussed with patient certain preventive protocols, quality metrics, and best practice recommendations.    Darrick Huntsman, LPN  3/43/5686 Nurse Health Advisor  Nurse Notes: Pt declined mammogram and DEXA referrals today.

## 2018-08-08 ENCOUNTER — Other Ambulatory Visit: Payer: Self-pay | Admitting: Family Medicine

## 2018-08-08 NOTE — Telephone Encounter (Signed)
Pt needs refill on   Losartin 100-25  Metoprolol  100mg   Pravastatin  10 mg  [facility]  CB#  [phone]  Thanks Barth Kirks

## 2018-08-08 NOTE — Telephone Encounter (Signed)
Patient has not been seen in over 1 year. Ok to refill?

## 2018-08-09 NOTE — Telephone Encounter (Signed)
Let's get her an evisit for chronic disease f/u.  She can do home or drive up BP check. This week or next week

## 2018-08-09 NOTE — Telephone Encounter (Signed)
Pateint scheduled for 08/10/2018

## 2018-08-10 ENCOUNTER — Ambulatory Visit (INDEPENDENT_AMBULATORY_CARE_PROVIDER_SITE_OTHER): Payer: Medicare Other | Admitting: Family Medicine

## 2018-08-10 ENCOUNTER — Encounter: Payer: Self-pay | Admitting: Family Medicine

## 2018-08-10 VITALS — BP 153/82 | HR 76

## 2018-08-10 DIAGNOSIS — I10 Essential (primary) hypertension: Secondary | ICD-10-CM

## 2018-08-10 MED ORDER — AMLODIPINE BESYLATE 5 MG PO TABS: 5.0000 mg | ORAL_TABLET | Freq: Every day | ORAL | 1 refills | Status: DC

## 2018-08-10 MED ORDER — PRAVASTATIN SODIUM 10 MG PO TABS: 10.0000 mg | ORAL_TABLET | Freq: Every day | ORAL | 3 refills | Status: DC

## 2018-08-10 MED ORDER — METOPROLOL SUCCINATE ER 100 MG PO TB24: 100.0000 mg | ORAL_TABLET | Freq: Every day | ORAL | 3 refills | Status: DC

## 2018-08-10 MED ORDER — LOSARTAN POTASSIUM-HCTZ 100-25 MG PO TABS: 1.0000 | ORAL_TABLET | Freq: Every day | ORAL | 3 refills | Status: DC

## 2018-08-10 NOTE — Assessment & Plan Note (Signed)
Chronic and uncontrolled Previously, her blood pressures been well controlled at home and elevated in the clinic due to whitecoat hypertension Discussed importance of low-sodium diet and exercise We will continue losartan, HCTZ, metoprolol at current doses Add amlodipine 5 mg daily as blood pressure is consistently elevated No red flag symptoms, however Plan to recheck labs at next in person visit Follow-up in 1 month on blood pressure control

## 2018-08-10 NOTE — Progress Notes (Signed)
Patient: Kara French Female    DOB: 1950-04-25   68 y.o.   MRN: [id number] Visit Date: 08/10/2018  Today's Provider: Shirlee Latch, MD   Chief Complaint  Patient presents with   Hypertension   Subjective:    I, [name] CMA, am acting as a scribe for Shirlee Latch, MD.   Virtual Visit via Video Note  I connected with Kara French on 08/10/18 at  2:40 PM EDT by a video enabled telemedicine application and verified that I am speaking with the correct person using two identifiers.   I discussed the limitations of evaluation and management by telemedicine and the availability of in person appointments. The patient expressed understanding and agreed to proceed.   Patient location: home Provider location: home office Persons involved in the visit: patient, provider    HPI  Hypertension, follow-up:  BP Readings from Last 3 Encounters:  08/10/18 (!) 153/82  03/07/18 (!) 144/90  11/15/17 (!) 148/78    She was last seen for hypertension 9 months ago.  BP at that visit was 148/78. Management changes since that visit include none. She reports good compliance with treatment. She is not having side effects.  She is not exercising. She is adherent to low salt diet.   Outside blood pressures are:120-140/70-80. She is experiencing none.  Patient denies chest pain, chest pressure/discomfort, exertional chest pressure/discomfort, fatigue, irregular heart beat, lower extremity edema and palpitations.   Cardiovascular risk factors include hypertension.  Use of agents associated with hypertension: none.     Weight trend: stable Wt Readings from Last 3 Encounters:  03/07/18 188 lb 6.4 oz (85.5 kg)  11/15/17 184 lb (83.5 kg)  07/13/17 187 lb (84.8 kg)    Current diet: well balanced   Feels like the stress of the pandemic has caused the increase in BP.  It was well controled before this.  She limits her intake of information about this to once  daily.  Having more headaches, which she knows is related to high BP.  No CP, vision changes, SOB. ------------------------------------------------------------------------    Allergies  Allergen Reactions   Aller-Chlor  [Chlorpheniramine]    Benzalkonium Chloride Other (See Comments)    BLISTERS   Neomycin-Bacitracin Zn-Polymyx    Tape    Decongestant [Pseudoephedrine Hcl Er] Itching   Iodinated Diagnostic Agents Rash and Hives     Current Outpatient Medications:    aspirin 81 MG chewable tablet, Chew 1 tablet by mouth daily., Disp: , Rfl:    cetirizine (ZYRTEC) 10 MG tablet, Take 10 mg by mouth daily., Disp: , Rfl:    losartan-hydrochlorothiazide (HYZAAR) 100-25 MG tablet, Take 1 tablet by mouth daily., Disp: 90 tablet, Rfl: 3   metoprolol succinate (TOPROL-XL) 100 MG 24 hr tablet, Take 1 tablet (100 mg total) by mouth daily. Take with or immediately following a meal., Disp: 90 tablet, Rfl: 3   Olopatadine HCl (PATADAY) 0.2 % SOLN, Apply 1 drop to eye daily. Both eyes, Disp: , Rfl:    pantoprazole (PROTONIX) 40 MG tablet, Take 1 tablet (40 mg total) by mouth daily., Disp: 90 tablet, Rfl: 3   pravastatin (PRAVACHOL) 10 MG tablet, Take 1 tablet (10 mg total) by mouth daily., Disp: 90 tablet, Rfl: 3   PREBIOTIC PRODUCT PO, Take by mouth daily. , Disp: , Rfl:    ondansetron (ZOFRAN) 4 MG tablet, Take 1 tablet (4 mg total) by mouth every 8 (eight) hours as needed for nausea or vomiting. (Patient  not taking: Reported on 07/18/2018), Disp: 45 tablet, Rfl: 0  Review of Systems  Constitutional: Negative.   Respiratory: Negative.   Cardiovascular: Negative.     Social History   Tobacco Use   Smoking status: Former Smoker    Packs/day: 0.50    Years: 15.00    Pack years: 7.50    Types: Cigarettes    Last attempt to quit: 04/18/1997    Years since quitting: 21.3   Smokeless tobacco: Never Used  Substance Use Topics   Alcohol use: No      Objective:   BP (!)  153/82 (BP Location: Left Arm, Patient Position: Sitting, Cuff Size: Normal)    Pulse 76  Vitals:   08/10/18 1354  BP: (!) 153/82  Pulse: 76     Physical Exam Constitutional:      Appearance: Normal appearance.  Pulmonary:     Effort: Pulmonary effort is normal. No respiratory distress.  Neurological:     Mental Status: She is alert and oriented to person, place, and time.  Psychiatric:        Mood and Affect: Mood normal.         Assessment & Plan  Follow Up Instructions: I discussed the assessment and treatment plan with the patient. The patient was provided an opportunity to ask questions and all were answered. The patient agreed with the plan and demonstrated an understanding of the instructions.   The patient was advised to call back or seek an in-person evaluation if the symptoms worsen or if the condition fails to improve as anticipated.  Problem List Items Addressed This Visit      Cardiovascular and Mediastinum   Hypertension - Primary    Chronic and uncontrolled Previously, her blood pressures been well controlled at home and elevated in the clinic due to whitecoat hypertension Discussed importance of low-sodium diet and exercise We will continue losartan, HCTZ, metoprolol at current doses Add amlodipine 5 mg daily as blood pressure is consistently elevated No red flag symptoms, however Plan to recheck labs at next in person visit Follow-up in 1 month on blood pressure control      Relevant Medications   amLODipine (NORVASC) 5 MG tablet       Return in about 4 weeks (around 09/07/2018) for BP f/u.   The entirety of the information documented in the History of Present Illness, Review of Systems and Physical Exam were personally obtained by me. Portions of this information were initially documented by [facility], CMA and reviewed by me for thoroughness and accuracy.    Erasmo DownerBacigalupo, Inman Fettig M, MD, MPH [facility] 08/10/2018 3:10 PM

## 2018-08-25 ENCOUNTER — Telehealth: Payer: Self-pay

## 2018-08-25 NOTE — Telephone Encounter (Signed)
Patient was advised.  

## 2018-08-25 NOTE — Telephone Encounter (Signed)
Have her try just 1/2 pill daily and see if it gets better.  Then we can see if it controls her BP at upcoming f/u appt

## 2018-08-25 NOTE — Telephone Encounter (Signed)
Patient states she started Amlodipine a few weeks ago. She now is experiencing leg and ankle swelling. Please advise.

## 2018-09-13 ENCOUNTER — Encounter: Payer: Self-pay | Admitting: Family Medicine

## 2018-09-13 ENCOUNTER — Ambulatory Visit (INDEPENDENT_AMBULATORY_CARE_PROVIDER_SITE_OTHER): Payer: Medicare Other | Admitting: Family Medicine

## 2018-09-13 VITALS — BP 129/62 | HR 71

## 2018-09-13 DIAGNOSIS — I10 Essential (primary) hypertension: Secondary | ICD-10-CM

## 2018-09-13 NOTE — Progress Notes (Signed)
Patient: Kara French Female    DOB: [birth year]   68 y.o.   MRN: [id number] Visit Date: 09/14/2018  Today's Provider: Shirlee Latch, MD   Chief Complaint  Patient presents with  . Hypertension   Subjective:    I, [name] CMA, am acting as a scribe for Shirlee Latch, MD.    Virtual Visit via Video Note  I connected with Beula Melin Manard on 09/14/18 at 11:00 AM EDT by a video enabled telemedicine application and verified that I am speaking with the correct person using two identifiers.   Patient location: home Provider location: [facility] Persons involved in the visit: patient, provider    I discussed the limitations of evaluation and management by telemedicine and the availability of in person appointments. The patient expressed understanding and agreed to proceed.   HPI  Hypertension, follow-up:  BP Readings from Last 3 Encounters:  09/13/18 129/62  08/10/18 (!) 153/82  03/07/18 (!) 144/90    She was last seen for hypertension 1 months ago.  BP at that visit was 153/82. Management changes since that visit include: patient states that she stopped taking the Amlodipine due to causing side effects. She reports good compliance with treatment. She is having side effects from the amlodipine.  She is not exercising. She is adherent to low salt diet.   Outside blood pressures are arranges: 110's-150's/60's-80's. She is experiencing lower extremity edema.  Patient denies chest pain, chest pressure/discomfort, exertional chest pressure/discomfort, irregular heart beat and palpitations.   Cardiovascular risk factors include advanced age (older than 21 for men, 22 for women) and hypertension.  Use of agents associated with hypertension: none.     Weight trend: stable Wt Readings from Last 3 Encounters:  03/07/18 188 lb 6.4 oz (85.5 kg)  11/15/17 184 lb (83.5 kg)  07/13/17 187 lb (84.8 kg)    Current diet: well balanced   ------------------------------------------------------------------------ Had leg swelling and flushing on amlodipine.  Has not taken it in 9-10 days.  Home BP readings 129/62 yesterday, other times 130s/70s. Was in 110s/60 on amlodipine. She admits that her diet has improved.  Thinks that stress increased it previously too   Allergies  Allergen Reactions  . Aller-Chlor  [Chlorpheniramine]   . Benzalkonium Chloride Other (See Comments)    BLISTERS  . Neomycin-Bacitracin Zn-Polymyx   . Tape   . Decongestant [Pseudoephedrine Hcl Er] Itching  . Iodinated Diagnostic Agents Rash and Hives     Current Outpatient Medications:  .  aspirin 81 MG chewable tablet, Chew 1 tablet by mouth daily., Disp: , Rfl:  .  cetirizine (ZYRTEC) 10 MG tablet, Take 10 mg by mouth daily., Disp: , Rfl:  .  losartan-hydrochlorothiazide (HYZAAR) 100-25 MG tablet, Take 1 tablet by mouth daily., Disp: 90 tablet, Rfl: 3 .  metoprolol succinate (TOPROL-XL) 100 MG 24 hr tablet, Take 1 tablet (100 mg total) by mouth daily. Take with or immediately following a meal., Disp: 90 tablet, Rfl: 3 .  Olopatadine HCl (PATADAY) 0.2 % SOLN, Apply 1 drop to eye daily. Both eyes, Disp: , Rfl:  .  pantoprazole (PROTONIX) 40 MG tablet, Take 1 tablet (40 mg total) by mouth daily., Disp: 90 tablet, Rfl: 3 .  pravastatin (PRAVACHOL) 10 MG tablet, Take 1 tablet (10 mg total) by mouth daily., Disp: 90 tablet, Rfl: 3 .  PREBIOTIC PRODUCT PO, Take by mouth daily. , Disp: , Rfl:  .  ondansetron (ZOFRAN) 4 MG tablet, Take 1 tablet (4 mg  total) by mouth every 8 (eight) hours as needed for nausea or vomiting. (Patient not taking: Reported on 07/18/2018), Disp: 45 tablet, Rfl: 0  Review of Systems  Social History   Tobacco Use  . Smoking status: Former Smoker    Packs/day: 0.50    Years: 15.00    Pack years: 7.50    Types: Cigarettes    Last attempt to quit: 04/18/1997    Years since quitting: 21.4  . Smokeless tobacco: Never Used   Substance Use Topics  . Alcohol use: No      Objective:   BP 129/62   Pulse 71  Vitals:   09/13/18 1015 09/13/18 1100  BP: (!) 159/85 129/62  Pulse: 71      Physical Exam Constitutional:      Appearance: Normal appearance.  Pulmonary:     Effort: Pulmonary effort is normal. No respiratory distress.  Neurological:     Mental Status: She is alert and oriented to person, place, and time.  Psychiatric:        Mood and Affect: Mood normal.        Behavior: Behavior normal.         Assessment & Plan  Follow Up Instructions: I discussed the assessment and treatment plan with the patient. The patient was provided an opportunity to ask questions and all were answered. The patient agreed with the plan and demonstrated an understanding of the instructions.   The patient was advised to call back or seek an in-person evaluation if the symptoms worsen or if the condition fails to improve as anticipated.   Problem List Items Addressed This Visit      Cardiovascular and Mediastinum   Hypertension - Primary    Chronic, asymptomatic Previously, her blood pressures had been well controlled at home and elevated in the clinic due to whitecoat hypertension Her initial blood pressure this morning was elevated while on the phone with the CMA, but repeating it it was well controlled We will continue losartan, HCTZ, metoprolol at current dose Discussed importance of low-sodium diet and exercise Amlodipine was self discontinued and we will not resume at this time Plan to recheck labs at next in-person visit          Return in about 2 weeks (around 09/27/2018) for BP f/u.   The entirety of the information documented in the History of Present Illness, Review of Systems and Physical Exam were personally obtained by me. Portions of this information were initially documented by [facility] McClurkin, CMA and reviewed by me for thoroughness and accuracy.    Bacigalupo, Marzella SchleinAngela M, MD MPH  [facility]

## 2018-09-14 NOTE — Assessment & Plan Note (Signed)
Chronic, asymptomatic Previously, her blood pressures had been well controlled at home and elevated in the clinic due to whitecoat hypertension Her initial blood pressure this morning was elevated while on the phone with the CMA, but repeating it it was well controlled We will continue losartan, HCTZ, metoprolol at current dose Discussed importance of low-sodium diet and exercise Amlodipine was self discontinued and we will not resume at this time Plan to recheck labs at next in-person visit

## 2018-09-27 ENCOUNTER — Encounter: Payer: Self-pay | Admitting: Family Medicine

## 2018-09-27 ENCOUNTER — Ambulatory Visit (INDEPENDENT_AMBULATORY_CARE_PROVIDER_SITE_OTHER): Payer: Medicare Other | Admitting: Family Medicine

## 2018-09-27 VITALS — BP 125/71 | HR 70 | Wt 189.7 lb

## 2018-09-27 DIAGNOSIS — I10 Essential (primary) hypertension: Secondary | ICD-10-CM | POA: Diagnosis not present

## 2018-09-27 NOTE — Progress Notes (Signed)
Patient: Kara French Female    DOB: 09/14/1950   68 y.o.   MRN: [id number] Visit Date: 09/27/2018  Today's Provider: Shirlee LatchAngela Bacigalupo, MD   Chief Complaint  Patient presents with  . Hypertension   Subjective:    I, [name] CMA, am acting as a scribe for Shirlee LatchAngela Bacigalupo, MD.   Virtual Visit via Video Note  I connected with Kara French on 09/27/18 at  3:40 PM EDT by a video enabled telemedicine application and verified that I am speaking with the correct person using two identifiers.   Patient location: home [facility] Persons involved in the visit: patient, provider   I discussed the limitations of evaluation and management by telemedicine and the availability of in person appointments. The patient expressed understanding and agreed to proceed.  HPI  Hypertension, follow-up:  BP Readings from Last 3 Encounters:  09/27/18 125/71  09/13/18 129/62  08/10/18 (!) 153/82    She was last seen for hypertension 2 weeks ago.  BP at that visit was 129/62. Management changes since that visit include none. She reports good compliance with treatment. She is not having side effects.  She is not exercising. She is adherent to low salt diet.   Outside blood pressures are:120's-130's/60's-80's. She is experiencing none.  Patient denies chest pain, chest pressure/discomfort, exertional chest pressure/discomfort, fatigue, irregular heart beat, lower extremity edema, palpitations and tachypnea.   Cardiovascular risk factors include advanced age (older than 6755 for men, 6065 for women) and hypertension.  Use of agents associated with hypertension: none.     Weight trend: stable Wt Readings from Last 3 Encounters:  09/27/18 189 lb 11.2 oz (86 kg)  03/07/18 188 lb 6.4 oz (85.5 kg)  11/15/17 184 lb (83.5 kg)    Current diet: well balanced  ------------------------------------------------------------------------    Allergies   Allergen Reactions  . Aller-Chlor  [Chlorpheniramine]   . Benzalkonium Chloride Other (See Comments)    BLISTERS  . Neomycin-Bacitracin Zn-Polymyx   . Tape   . Decongestant [Pseudoephedrine Hcl Er] Itching  . Iodinated Diagnostic Agents Rash and Hives     Current Outpatient Medications:  .  aspirin 81 MG chewable tablet, Chew 1 tablet by mouth daily., Disp: , Rfl:  .  cetirizine (ZYRTEC) 10 MG tablet, Take 10 mg by mouth daily., Disp: , Rfl:  .  losartan-hydrochlorothiazide (HYZAAR) 100-25 MG tablet, Take 1 tablet by mouth daily., Disp: 90 tablet, Rfl: 3 .  metoprolol succinate (TOPROL-XL) 100 MG 24 hr tablet, Take 1 tablet (100 mg total) by mouth daily. Take with or immediately following a meal., Disp: 90 tablet, Rfl: 3 .  Olopatadine HCl (PATADAY) 0.2 % SOLN, Apply 1 drop to eye daily. Both eyes, Disp: , Rfl:  .  ondansetron (ZOFRAN) 4 MG tablet, Take 1 tablet (4 mg total) by mouth every 8 (eight) hours as needed for nausea or vomiting., Disp: 45 tablet, Rfl: 0 .  pantoprazole (PROTONIX) 40 MG tablet, Take 1 tablet (40 mg total) by mouth daily., Disp: 90 tablet, Rfl: 3 .  pravastatin (PRAVACHOL) 10 MG tablet, Take 1 tablet (10 mg total) by mouth daily., Disp: 90 tablet, Rfl: 3 .  PREBIOTIC PRODUCT PO, Take by mouth daily. , Disp: , Rfl:   Review of Systems  Constitutional: Negative.   HENT: Negative.   Respiratory: Negative.   Cardiovascular: Negative.   Genitourinary: Negative.   Neurological: Negative.   Psychiatric/Behavioral: Negative.     Social History  Tobacco Use  . Smoking status: Former Smoker    Packs/day: 0.50    Years: 15.00    Pack years: 7.50    Types: Cigarettes    Last attempt to quit: 04/18/1997    Years since quitting: 21.4  . Smokeless tobacco: Never Used  Substance Use Topics  . Alcohol use: No      Objective:   BP 125/71 (BP Location: Left Arm, Patient Position: Sitting, Cuff Size: Normal)   Pulse 70   Wt 189 lb 11.2 oz (86 kg)   BMI 29.71  kg/m  Vitals:   09/27/18 1351  BP: 125/71  Pulse: 70  Weight: 189 lb 11.2 oz (86 kg)     Physical Exam Constitutional:      Appearance: Normal appearance.  HENT:     Head: Normocephalic and atraumatic.  Pulmonary:     Effort: Pulmonary effort is normal. No respiratory distress.  Neurological:     Mental Status: She is alert and oriented to person, place, and time.  Psychiatric:        Mood and Affect: Mood normal.        Behavior: Behavior normal.         Assessment & Plan    I discussed the assessment and treatment plan with the patient. The patient was provided an opportunity to ask questions and all were answered. The patient agreed with the plan and demonstrated an understanding of the instructions.   The patient was advised to call back or seek an in-person evaluation if the symptoms worsen or if the condition fails to improve as anticipated.   Problem List Items Addressed This Visit      Cardiovascular and Mediastinum   Hypertension - Primary    Chronic and asymptomatic Well controlled Continue metoprolol, losartan HCTZ at current dose Recheck BP and metabolic panel at CPE in ~2 months          Return in about 2 months (around 11/27/2018) for CPE as scheduled.   The entirety of the information documented in the History of Present Illness, Review of Systems and Physical Exam were personally obtained by me. Portions of this information were initially documented by [facility], CMA and reviewed by me for thoroughness and accuracy.    Bacigalupo, Dionne Bucy, MD MPH [facility]

## 2018-09-27 NOTE — Assessment & Plan Note (Signed)
Chronic and asymptomatic Well controlled Continue metoprolol, losartan HCTZ at current dose Recheck BP and metabolic panel at CPE in ~2 months

## 2018-11-22 ENCOUNTER — Encounter: Payer: Medicare Other | Admitting: Family Medicine

## 2019-02-15 ENCOUNTER — Encounter: Payer: Self-pay | Admitting: Family Medicine

## 2019-02-15 ENCOUNTER — Ambulatory Visit (INDEPENDENT_AMBULATORY_CARE_PROVIDER_SITE_OTHER): Payer: Medicare Other | Admitting: Family Medicine

## 2019-02-15 ENCOUNTER — Other Ambulatory Visit: Payer: Self-pay

## 2019-02-15 VITALS — BP 128/78 | HR 69 | Temp 97.6°F | Resp 16 | Ht 67.0 in | Wt 199.6 lb

## 2019-02-15 DIAGNOSIS — K2100 Gastro-esophageal reflux disease with esophagitis, without bleeding: Secondary | ICD-10-CM

## 2019-02-15 DIAGNOSIS — M8589 Other specified disorders of bone density and structure, multiple sites: Secondary | ICD-10-CM

## 2019-02-15 DIAGNOSIS — I10 Essential (primary) hypertension: Secondary | ICD-10-CM | POA: Diagnosis not present

## 2019-02-15 DIAGNOSIS — Z0000 Encounter for general adult medical examination without abnormal findings: Secondary | ICD-10-CM

## 2019-02-15 DIAGNOSIS — Z780 Asymptomatic menopausal state: Secondary | ICD-10-CM

## 2019-02-15 DIAGNOSIS — Z23 Encounter for immunization: Secondary | ICD-10-CM

## 2019-02-15 DIAGNOSIS — M810 Age-related osteoporosis without current pathological fracture: Secondary | ICD-10-CM | POA: Insufficient documentation

## 2019-02-15 DIAGNOSIS — K22719 Barrett's esophagus with dysplasia, unspecified: Secondary | ICD-10-CM

## 2019-02-15 DIAGNOSIS — J4520 Mild intermittent asthma, uncomplicated: Secondary | ICD-10-CM

## 2019-02-15 DIAGNOSIS — E782 Mixed hyperlipidemia: Secondary | ICD-10-CM

## 2019-02-15 DIAGNOSIS — J45909 Unspecified asthma, uncomplicated: Secondary | ICD-10-CM | POA: Insufficient documentation

## 2019-02-15 MED ORDER — ALBUTEROL SULFATE HFA 108 (90 BASE) MCG/ACT IN AERS: 2.0000 | INHALATION_SPRAY | Freq: Four times a day (QID) | RESPIRATORY_TRACT | 3 refills | Status: DC | PRN

## 2019-02-15 NOTE — Assessment & Plan Note (Signed)
Fair control, but diet dependent Continue PPI Given Barrett's esophagus, as above, she needs repeat EGD this year

## 2019-02-15 NOTE — Progress Notes (Signed)
Patient: Kara French, Female    DOB: [birth year], 68 y.o.   MRN: [id number] Visit Date: 02/15/2019  Today's Provider: Lavon Paganini, MD   Chief Complaint  Patient presents with  . Annual Exam   Subjective:     Complete Physical Kara French is a 68 y.o. female. She feels well. She reports exercising no. She reports she is sleeping well.   07/18/2018 AWV with [facility]  09/05/2015 Colonoscopy-diverticulosis, hemorrhoids  Cataract surgery coming up in November and December  Due for EGD for Barrett's esophagus -----------------------------------------------------------  She noticed that if she was outside when the pollen was bad that she would have allergy-induced asthma.  She would have wheezing and SOB.  She has had this previously.  She tried Symbicort once and got night terrors.  Review of Systems  Constitutional: Negative.   HENT: Negative.   Eyes: Negative.   Respiratory: Negative.   Cardiovascular: Negative.   Gastrointestinal: Negative.   Endocrine: Negative.   Genitourinary: Negative.   Musculoskeletal: Negative.   Skin: Negative.   Allergic/Immunologic: Negative.   Neurological: Negative.   Hematological: Negative.   Psychiatric/Behavioral: Negative.     Social History   Socioeconomic History  . Marital status: Widowed    Spouse name: Not on file  . Number of children: 1  . Years of education: post-graduate  . Highest education level: Master's degree (e.g., MA, MS, MEng, MEd, MSW, MBA)  Occupational History  . Occupation: retired    Comment: Astronomer  Social Needs  . Financial resource strain: Not hard at all  . Food insecurity    Worry: Never true    Inability: Never true  . Transportation needs    Medical: No    Non-medical: No  Tobacco Use  . Smoking status: Former Smoker    Packs/day: 0.50    Years: 15.00    Pack years: 7.50    Types: Cigarettes    Quit date: 04/18/1997    Years since quitting: 21.8  . Smokeless  tobacco: Never Used  Substance and Sexual Activity  . Alcohol use: No  . Drug use: No  . Sexual activity: Not Currently  Lifestyle  . Physical activity    Days per week: 0 days    Minutes per session: 0 min  . Stress: Not at all  Relationships  . Social Herbalist on phone: Patient refused    Gets together: Patient refused    Attends religious service: Patient refused    Active member of club or organization: Patient refused    Attends meetings of clubs or organizations: Patient refused    Relationship status: Patient refused  . Intimate partner violence    Fear of current or ex partner: Patient refused    Emotionally abused: Patient refused    Physically abused: Patient refused    Forced sexual activity: Patient refused  Other Topics Concern  . Not on file  Social History Narrative  . Not on file    Past Medical History:  Diagnosis Date  . Allergic rhinitis   . Barrett's esophagus 2017   biopsy confirmed, related to GERD  . GERD (gastroesophageal reflux disease)   . Hyperlipidemia   . Hypertension   . Mitral valve prolapse    followed by cardiology     Patient Active Problem List   Diagnosis Date Noted  . IBS (irritable bowel syndrome) 05/11/2017  . Other ascites 05/09/2017  . Hypertension   . Hyperlipidemia   .  Allergic rhinitis   . GERD (gastroesophageal reflux disease)   . Mitral valve prolapse   . Barrett's esophagus 04/20/2015    Past Surgical History:  Procedure Laterality Date  . BARTHOLIN GLAND CYST EXCISION  1975  . BREAST ENHANCEMENT SURGERY Bilateral 1975  . LAPAROSCOPIC CHOLECYSTECTOMY  2011  . LAPAROSCOPIC OOPHERECTOMY Right 1987   2/2 benign tumor  . TRANSANAL EXCISION OF RECTAL MASS WITH HEMORRHOID INJECTION  1989   hemorrhoid removal  . TUBAL LIGATION  1987    Her family history includes Breast cancer (age of onset: 5668) in her paternal aunt; Colon cancer (age of onset: 7043) in her sister; Congestive Heart Failure (age of  onset: 7482) in her maternal grandmother; Dementia (age of onset: 2375) in her mother; Healthy in her paternal grandmother; Heart attack (age of onset: 366) in her maternal grandfather; Heart disease in her brother and brother; Heart disease (age of onset: 5848) in her mother.   Current Outpatient Medications:  .  aspirin 81 MG chewable tablet, Chew 1 tablet by mouth daily., Disp: , Rfl:  .  cetirizine (ZYRTEC) 10 MG tablet, Take 10 mg by mouth daily., Disp: , Rfl:  .  losartan-hydrochlorothiazide (HYZAAR) 100-25 MG tablet, Take 1 tablet by mouth daily., Disp: 90 tablet, Rfl: 3 .  metoprolol succinate (TOPROL-XL) 100 MG 24 hr tablet, Take 1 tablet (100 mg total) by mouth daily. Take with or immediately following a meal., Disp: 90 tablet, Rfl: 3 .  Olopatadine HCl (PATADAY) 0.2 % SOLN, Apply 1 drop to eye daily. Both eyes, Disp: , Rfl:  .  ondansetron (ZOFRAN) 4 MG tablet, Take 1 tablet (4 mg total) by mouth every 8 (eight) hours as needed for nausea or vomiting., Disp: 45 tablet, Rfl: 0 .  pantoprazole (PROTONIX) 40 MG tablet, Take 1 tablet (40 mg total) by mouth daily., Disp: 90 tablet, Rfl: 3 .  pravastatin (PRAVACHOL) 10 MG tablet, Take 1 tablet (10 mg total) by mouth daily., Disp: 90 tablet, Rfl: 3 .  PREBIOTIC PRODUCT PO, Take by mouth daily. , Disp: , Rfl:   Patient Care Team: Erasmo DownerBacigalupo, Brandan Robicheaux M, MD as PCP - General (Family Medicine) Toney ReilVanga, Rohini Reddy, MD as Consulting Physician (Gastroenterology)     Objective:    Vitals: BP 128/78 (BP Location: Left Arm, Patient Position: Sitting, Cuff Size: Large)   Pulse 69   Temp 97.6 F (36.4 C) (Temporal)   Resp 16   Ht 5\' 7"  (1.702 m)   Wt 199 lb 9.6 oz (90.5 kg)   BMI 31.26 kg/m   Physical Exam Vitals signs reviewed.  Constitutional:      General: She is not in acute distress.    Appearance: Normal appearance. She is well-developed. She is not diaphoretic.  HENT:     Head: Normocephalic and atraumatic.     Right Ear: Tympanic  membrane, ear canal and external ear normal.     Left Ear: Tympanic membrane, ear canal and external ear normal.  Eyes:     General: No scleral icterus.    Conjunctiva/sclera: Conjunctivae normal.     Comments: Pupils dilated from eye exam right before our appointment  Neck:     Musculoskeletal: Neck supple.     Thyroid: No thyromegaly.  Cardiovascular:     Rate and Rhythm: Normal rate and regular rhythm.     Pulses: Normal pulses.     Heart sounds: Normal heart sounds. No murmur.  Pulmonary:     Effort: Pulmonary effort is normal. No  respiratory distress.     Breath sounds: Normal breath sounds. No wheezing or rales.  Abdominal:     General: There is no distension.     Palpations: Abdomen is soft.     Tenderness: There is no abdominal tenderness.  Genitourinary:    Comments: Breasts: breasts appear normal, no suspicious masses, no skin or nipple changes or axillary nodes.  Musculoskeletal:        General: No deformity.     Right lower leg: No edema.     Left lower leg: No edema.  Lymphadenopathy:     Cervical: No cervical adenopathy.  Skin:    General: Skin is warm and dry.     Capillary Refill: Capillary refill takes less than 2 seconds.     Findings: No rash.  Neurological:     Mental Status: She is alert and oriented to person, place, and time. Mental status is at baseline.  Psychiatric:        Mood and Affect: Mood normal.        Behavior: Behavior normal.        Thought Content: Thought content normal.     Activities of Daily Living In your present state of health, do you have any difficulty performing the following activities: 02/15/2019 07/18/2018  Hearing? N N  Vision? Y Y  Comment - Due to cataracts- sx scheduled for 07/2018.  Difficulty concentrating or making decisions? N N  Walking or climbing stairs? Y Y  Comment - Due to both knee pains.   Dressing or bathing? N N  Doing errands, shopping? N N  Preparing Food and eating ? - N  Using the Toilet? - N   In the past six months, have you accidently leaked urine? - N  Do you have problems with loss of bowel control? - N  Managing your Medications? - N  Managing your Finances? - N  Housekeeping or managing your Housekeeping? - N  Some recent data might be hidden    Fall Risk Assessment Fall Risk  02/15/2019 07/18/2018 06/06/2017 01/13/2017  Falls in the past year? 0 0 No No  Number falls in past yr: 0 - - -  Injury with Fall? 0 - - -  Follow up Falls evaluation completed - - -     Depression Screen PHQ 2/9 Scores 02/15/2019 07/18/2018 06/06/2017 01/13/2017  PHQ - 2 Score 0 0 0 0  PHQ- 9 Score 0 - - -    No flowsheet data found.     Assessment & Plan:    Annual Physical Reviewed patient's Family Medical History Reviewed and updated list of patient's medical providers Assessment of cognitive impairment was done Assessed patient's functional ability Established a written schedule for health screening services Health Risk Assessent Completed and Reviewed  Exercise Activities and Dietary recommendations Goals    . Exercise 3x per week (30 min per time)     [facility] and exercise 5 days a week for 30 minutes or more.        Immunization History  Administered Date(s) Administered  . Influenza, High Dose Seasonal PF 01/13/2017, 02/14/2018  . Pneumococcal Conjugate-13 06/07/2014  . Pneumococcal Polysaccharide-23 06/23/2015  . Tdap 03/17/2016    Health Maintenance  Topic Date Due  . MAMMOGRAM  10/30/2016  . DEXA SCAN  10/30/2016  . INFLUENZA VACCINE  11/18/2018  . COLONOSCOPY  09/04/2020  . TETANUS/TDAP  03/17/2026  . Hepatitis C Screening  Completed  . PNA vac Low Risk Adult  Completed     Discussed health benefits of physical activity, and encouraged her to engage in regular exercise appropriate for her age and condition.     Patient declines mammogram as she is worried about her breast implants that are more than 70 years  old ------------------------------------------------------------------------------------------------------------  Problem List Items Addressed This Visit      Cardiovascular and Mediastinum   Hypertension    Well controlled Continue current medications Recheck metabolic panel F/u in 6 months       Relevant Orders   Comprehensive metabolic panel     Respiratory   Extrinsic asthma    Agree with patient that she likely has some allergic asthma related to her severe allergic rhinitis She has been treated for this previously and did not tolerate Symbicort We will try albuterol as needed Return precautions discussed      Relevant Medications   albuterol (VENTOLIN HFA) 108 (90 Base) MCG/ACT inhaler     Digestive   Barrett's esophagus    Biopsy confirmed on EGD in 2017 per records Continue PPI Check CBC Will need repeat EGD this year-patient will call back with the name of the gastroenterologist who she would like to see for this      Relevant Orders   CBC   GERD (gastroesophageal reflux disease)    Fair control, but diet dependent Continue PPI Given Barrett's esophagus, as above, she needs repeat EGD this year      Relevant Orders   CBC     Musculoskeletal and Integument   Osteopenia of multiple sites    Reviewed last bone density scan that showed osteopenia Patient agrees to repeat bone density scan      Relevant Orders   DG Bone Density     Other   Hyperlipidemia    Continue statin and baby aspirin Repeat lipid panel and CMP      Relevant Orders   Comprehensive metabolic panel   Lipid panel    Other Visit Diagnoses    Encounter for annual physical exam    -  Primary   Relevant Orders   DG Bone Density   Comprehensive metabolic panel   Lipid panel   CBC   Postmenopausal       Relevant Orders   DG Bone Density   Need for influenza vaccination       Relevant Orders   Flu Vaccine QUAD High Dose(Fluad) (Completed)       Return in about 6 months  (around 08/16/2019) for AWV, chronic disease f/u.   The entirety of the information documented in the History of Present Illness, Review of Systems and Physical Exam were personally obtained by me. Portions of this information were initially documented by Kavin Leech, CMA and reviewed by me for thoroughness and accuracy.    Beacher Every, Marzella Schlein, MD MPH [facility]

## 2019-02-15 NOTE — Assessment & Plan Note (Signed)
Biopsy confirmed on EGD in 2017 per records Continue PPI Check CBC Will need repeat EGD this year-patient will call back with the name of the gastroenterologist who she would like to see for this

## 2019-02-15 NOTE — Assessment & Plan Note (Signed)
Continue statin and baby aspirin Repeat lipid panel and CMP

## 2019-02-15 NOTE — Assessment & Plan Note (Signed)
Well controlled Continue current medications Recheck metabolic panel F/u in 6 months  

## 2019-02-15 NOTE — Patient Instructions (Addendum)
 The CDC recommends two doses of Shingrix (the shingles vaccine) separated by 2 to 6 months for adults age 70 years and older. I recommend checking with your insurance plan regarding coverage for this vaccine.     Preventive Care 65 Years and Older, Female Preventive care refers to lifestyle choices and visits with your health care provider that can promote health and wellness. This includes:  A yearly physical exam. This is also called an annual well check.  Regular dental and eye exams.  Immunizations.  Screening for certain conditions.  Healthy lifestyle choices, such as diet and exercise. What can I expect for my preventive care visit? Physical exam Your health care provider will check:  Height and weight. These may be used to calculate body mass index (BMI), which is a measurement that tells if you are at a healthy weight.  Heart rate and blood pressure.  Your skin for abnormal spots. Counseling Your health care provider may ask you questions about:  Alcohol, tobacco, and drug use.  Emotional well-being.  Home and relationship well-being.  Sexual activity.  Eating habits.  History of falls.  Memory and ability to understand (cognition).  Work and work environment.  Pregnancy and menstrual history. What immunizations do I need?  Influenza (flu) vaccine  This is recommended every year. Tetanus, diphtheria, and pertussis (Tdap) vaccine  You may need a Td booster every 10 years. Varicella (chickenpox) vaccine  You may need this vaccine if you have not already been vaccinated. Zoster (shingles) vaccine  You may need this after age 60. Pneumococcal conjugate (PCV13) vaccine  One dose is recommended after age 65. Pneumococcal polysaccharide (PPSV23) vaccine  One dose is recommended after age 65. Measles, mumps, and rubella (MMR) vaccine  You may need at least one dose of MMR if you were born in 1957 or later. You may also need a second  dose. Meningococcal conjugate (MenACWY) vaccine  You may need this if you have certain conditions. Hepatitis A vaccine  You may need this if you have certain conditions or if you travel or work in places where you may be exposed to hepatitis A. Hepatitis B vaccine  You may need this if you have certain conditions or if you travel or work in places where you may be exposed to hepatitis B. Haemophilus influenzae type b (Hib) vaccine  You may need this if you have certain conditions. You may receive vaccines as individual doses or as more than one vaccine together in one shot (combination vaccines). Talk with your health care provider about the risks and benefits of combination vaccines. What tests do I need? Blood tests  Lipid and cholesterol levels. These may be checked every 5 years, or more frequently depending on your overall health.  Hepatitis C test.  Hepatitis B test. Screening  Lung cancer screening. You may have this screening every year starting at age 55 if you have a 30-pack-year history of smoking and currently smoke or have quit within the past 15 years.  Colorectal cancer screening. All adults should have this screening starting at age 70 and continuing until age 75. Your health care provider may recommend screening at age 45 if you are at increased risk. You will have tests every 1-10 years, depending on your results and the type of screening test.  Diabetes screening. This is done by checking your blood sugar (glucose) after you have not eaten for a while (fasting). You may have this done every 1-3 years.  Mammogram. This may   be done every 1-2 years. Talk with your health care provider about how often you should have regular mammograms.  BRCA-related cancer screening. This may be done if you have a family history of breast, ovarian, tubal, or peritoneal cancers. Other tests  Sexually transmitted disease (STD) testing.  Bone density scan. This is done to screen for  osteoporosis. You may have this done starting at age 65. Follow these instructions at home: Eating and drinking  Eat a diet that includes fresh fruits and vegetables, whole grains, lean protein, and low-fat dairy products. Limit your intake of foods with high amounts of sugar, saturated fats, and salt.  Take vitamin and mineral supplements as recommended by your health care provider.  Do not drink alcohol if your health care provider tells you not to drink.  If you drink alcohol: ? Limit how much you have to 0-1 drink a day. ? Be aware of how much alcohol is in your drink. In the [place] one drink equals one 12 oz bottle of beer (355 mL), one 5 oz glass of wine (148 mL), or one 1 oz glass of hard liquor (44 mL). Lifestyle  Take daily care of your teeth and gums.  Stay active. Exercise for at least 30 minutes on 5 or more days each week.  Do not use any products that contain nicotine or tobacco, such as cigarettes, e-cigarettes, and chewing tobacco. If you need help quitting, ask your health care provider.  If you are sexually active, practice safe sex. Use a condom or other form of protection in order to prevent STIs (sexually transmitted infections).  Talk with your health care provider about taking a low-dose aspirin or statin. What's next?  Go to your health care provider once a year for a well check visit.  Ask your health care provider how often you should have your eyes and teeth checked.  Stay up to date on all vaccines. This information is not intended to replace advice given to you by your health care provider. Make sure you discuss any questions you have with your health care provider. Document Released: 05/02/2015 Document Revised: 03/30/2018 Document Reviewed: 03/30/2018 Elsevier Patient Education  2020 [employer]vier Inc[employer]  

## 2019-02-15 NOTE — Assessment & Plan Note (Signed)
Agree with patient that she likely has some allergic asthma related to her severe allergic rhinitis She has been treated for this previously and did not tolerate Symbicort We will try albuterol as needed Return precautions discussed

## 2019-02-15 NOTE — Assessment & Plan Note (Signed)
Reviewed last bone density scan that showed osteopenia Patient agrees to repeat bone density scan

## 2019-02-27 LAB — COMPREHENSIVE METABOLIC PANEL
ALT: 16 IU/L (ref 0–32)
AST: 16 IU/L (ref 0–40)
Albumin/Globulin Ratio: 2.2 (ref 1.2–2.2)
Albumin: 4.2 g/dL (ref 3.8–4.8)
Alkaline Phosphatase: 64 IU/L (ref 39–117)
BUN/Creatinine Ratio: 15 (ref 12–28)
BUN: 13 mg/dL (ref 8–27)
Bilirubin Total: 0.5 mg/dL (ref 0.0–1.2)
CO2: 25 mmol/L (ref 20–29)
Calcium: 9.2 mg/dL (ref 8.7–10.3)
Chloride: 103 mmol/L (ref 96–106)
Creatinine, Ser: 0.86 mg/dL (ref 0.57–1.00)
GFR calc Af Amer: 79 mL/min/{1.73_m2} (ref >59)
GFR calc non Af Amer: 69 mL/min/{1.73_m2} (ref >59)
Globulin, Total: 1.9 g/dL (ref 1.5–4.5)
Glucose: 122 mg/dL — ABNORMAL HIGH (ref 65–99)
Potassium: 4.1 mmol/L (ref 3.5–5.2)
Sodium: 141 mmol/L (ref 134–144)
Total Protein: 6.1 g/dL (ref 6.0–8.5)

## 2019-02-27 LAB — CBC
Hematocrit: 37.7 % (ref 34.0–46.6)
Hemoglobin: 12.7 g/dL (ref 11.1–15.9)
MCH: 28.3 pg (ref 26.6–33.0)
MCHC: 33.7 g/dL (ref 31.5–35.7)
MCV: 84 fL (ref 79–97)
Platelets: 263 10*3/uL (ref 150–450)
RBC: 4.49 x10E6/uL (ref 3.77–5.28)
RDW: 13.2 % (ref 11.7–15.4)
WBC: 4.8 10*3/uL (ref 3.4–10.8)

## 2019-02-27 LAB — LIPID PANEL
Chol/HDL Ratio: 3.9 ratio (ref 0.0–4.4)
Cholesterol, Total: 173 mg/dL (ref 100–199)
HDL: 44 mg/dL (ref >39)
LDL Chol Calc (NIH): 118 mg/dL — ABNORMAL HIGH (ref 0–99)
Triglycerides: 57 mg/dL (ref 0–149)
VLDL Cholesterol Cal: 11 mg/dL (ref 5–40)

## 2019-02-28 ENCOUNTER — Telehealth: Payer: Self-pay

## 2019-02-28 HISTORY — PX: EYE SURGERY: SHX253

## 2019-02-28 NOTE — Telephone Encounter (Signed)
-----   Message from Virginia Crews, MD sent at 02/28/2019  3:02 PM EST ----- Normal labs, except blood sugar is elevated if patient was fasting.  If so, would add on A1c.  Cholesterol is slightly above goal.  I recommend increasing pravastatin t o20 mg daily and rechecking at next visit.

## 2019-02-28 NOTE — Telephone Encounter (Signed)
NA

## 2019-03-01 MED ORDER — PRAVASTATIN SODIUM 20 MG PO TABS: 20.0000 mg | ORAL_TABLET | Freq: Every day | ORAL | 1 refills | Status: DC

## 2019-03-01 NOTE — Telephone Encounter (Addendum)
Pt advised.  I called [facility] to add the A1C, pt reports she was fasting for her lab work.  Pt agreed to increase Pravastatin 20mg .  [facility].    Thanks,   -Mickel Baas

## 2019-03-02 ENCOUNTER — Other Ambulatory Visit: Payer: Self-pay | Admitting: Family Medicine

## 2019-03-02 DIAGNOSIS — K219 Gastro-esophageal reflux disease without esophagitis: Secondary | ICD-10-CM

## 2019-03-05 ENCOUNTER — Telehealth: Payer: Self-pay

## 2019-03-05 NOTE — Telephone Encounter (Signed)
-----   Message from Virginia Crews, MD sent at 03/05/2019  9:14 AM EST ----- A1c is normal.  No prediabetes or diabetes.

## 2019-03-05 NOTE — Telephone Encounter (Signed)
Pt advised.   Thanks,   -Aundray Cartlidge  

## 2019-03-14 LAB — SPECIMEN STATUS REPORT

## 2019-03-14 LAB — HGB A1C W/O EAG: Hgb A1c MFr Bld: 5.4 % (ref 4.8–5.6)

## 2019-03-19 ENCOUNTER — Other Ambulatory Visit: Payer: Medicare Other

## 2019-03-28 HISTORY — PX: EYE SURGERY: SHX253

## 2019-06-25 ENCOUNTER — Ambulatory Visit
Admission: RE | Admit: 2019-06-25 | Discharge: 2019-06-25 | Disposition: A | Discharge status: Home / Self Care | Payer: Medicare Other | Source: Ambulatory Visit | Attending: Family Medicine | Admitting: Family Medicine

## 2019-06-25 DIAGNOSIS — M8589 Other specified disorders of bone density and structure, multiple sites: Secondary | ICD-10-CM | POA: Insufficient documentation

## 2019-06-25 DIAGNOSIS — Z780 Asymptomatic menopausal state: Secondary | ICD-10-CM | POA: Insufficient documentation

## 2019-06-25 DIAGNOSIS — Z0000 Encounter for general adult medical examination without abnormal findings: Secondary | ICD-10-CM | POA: Insufficient documentation

## 2019-06-26 ENCOUNTER — Telehealth: Payer: Self-pay

## 2019-06-26 NOTE — Telephone Encounter (Signed)
Patient advised as directed below. Per patient she will wait until her the next appointment with the provider. Scheduled on 04/29

## 2019-06-26 NOTE — Telephone Encounter (Signed)
-----   Message from Erasmo Downer, MD sent at 06/25/2019  3:07 PM EST ----- Bone density scan shows osteopenia (this is some bone loss, but not as bad as osteoporosis) in the spine and femur and osteoporosis of the wrist.  May be a good candidate for medication for improving bone mass and preventing fractures.  Recommend regular weight bearing exercise, avoiding smoking, and adequate Ca (1200mg /day) and Vit D (1000 units daily) via diet or supplement.  Can schedule visit - virtual or in person to discuss treatment options.

## 2019-08-02 ENCOUNTER — Telehealth: Payer: Self-pay

## 2019-08-02 NOTE — Telephone Encounter (Signed)
LMTCB regarding AWV scheduled for 08/16/19. I will be out of the office that day. LM advising pt that and inquired about a telephonic visit some time next week.

## 2019-08-06 NOTE — Telephone Encounter (Signed)
R/s telephonic AWV for 08/09/19 @ 9:00 AM.

## 2019-08-08 NOTE — Progress Notes (Signed)
Subjective:   Kara French is a 69 y.o. female who presents for Medicare Annual (Subsequent) preventive examination.    This visit is being conducted through telemedicine due to the COVID-19 pandemic. This patient has given me verbal consent via doximity to conduct this visit, patient states they are participating from their home address. Some vital signs may be absent or patient reported.    Patient identification: identified by name, DOB, and current address  Review of Systems:  N/A  Cardiac Risk Factors include: advanced age (>51men, >39 women);dyslipidemia;hypertension     Objective:     Vitals: There were no vitals taken for this visit.  There is no height or weight on file to calculate BMI. Unable to obtain vitals due to visit being conducted via telephonically.   Advanced Directives 08/09/2019 07/18/2018 06/06/2017 01/13/2017 06/13/2016  Does Patient Have a Medical Advance Directive? No No No No No  Would patient like information on creating a medical advance directive? No - Patient declined No - Patient declined No - Patient declined - No - Patient declined    Tobacco Social History   Tobacco Use  Smoking Status Former Smoker  . Packs/day: 0.50  . Years: 15.00  . Pack years: 7.50  . Types: Cigarettes  . Quit date: 04/18/1997  . Years since quitting: 22.3  Smokeless Tobacco Never Used     Counseling given: Not Answered   Clinical Intake:  Pre-visit preparation completed: Yes  Pain : No/denies pain Pain Score: 0-No pain     Nutritional Risks: None Diabetes: No  How often do you need to have someone help you when you read instructions, pamphlets, or other written materials from your doctor or pharmacy?: 1 - Never  Interpreter Needed?: No  Information entered by :: [facility], LPN  Past Medical History:  Diagnosis Date  . Allergic rhinitis   . Barrett's esophagus 2017   biopsy confirmed, related to GERD  . GERD (gastroesophageal reflux disease)     . Hyperlipidemia   . Hypertension   . Mitral valve prolapse    followed by cardiology  . Precancerous skin lesion    Past Surgical History:  Procedure Laterality Date  . BARTHOLIN GLAND CYST EXCISION  1975  . BREAST ENHANCEMENT SURGERY Bilateral 1975  . LAPAROSCOPIC CHOLECYSTECTOMY  2011  . LAPAROSCOPIC OOPHERECTOMY Right 1987   2/2 benign tumor  . TRANSANAL EXCISION OF RECTAL MASS WITH HEMORRHOID INJECTION  1989   hemorrhoid removal  . TUBAL LIGATION  1987   Family History  Problem Relation Age of Onset  . Dementia Mother 74  . Heart disease Mother 2       s/p 7 stents and CABG x3  . Colon cancer Sister 47  . Heart disease Brother        s./p stent placement  . Heart disease Brother        s/p stent placement  . Congestive Heart Failure Maternal Grandmother 82  . Heart attack Maternal Grandfather 66  . Healthy Paternal Grandmother   . Breast cancer Paternal Aunt 15   Social History   Socioeconomic History  . Marital status: Widowed    Spouse name: Not on file  . Number of children: 1  . Years of education: post-graduate  . Highest education level: Master's degree (e.g., MA, MS, MEng, MEd, MSW, MBA)  Occupational History  . Occupation: retired    Comment: Human resources officer  Tobacco Use  . Smoking status: Former Smoker    Packs/day: 0.50  Years: 15.00    Pack years: 7.50    Types: Cigarettes    Quit date: 04/18/1997    Years since quitting: 22.3  . Smokeless tobacco: Never Used  Substance and Sexual Activity  . Alcohol use: No  . Drug use: No  . Sexual activity: Not Currently  Other Topics Concern  . Not on file  Social History Narrative  . Not on file   Social Determinants of Health   Financial Resource Strain: Low Risk   . Difficulty of Paying Living Expenses: Not hard at all  Food Insecurity: No Food Insecurity  . Worried About Programme researcher, broadcasting/film/video in the Last Year: Never true  . Ran Out of Food in the Last Year: Never true  Transportation  Needs: No Transportation Needs  . Lack of Transportation (Medical): No  . Lack of Transportation (Non-Medical): No  Physical Activity: Inactive  . Days of Exercise per Week: 0 days  . Minutes of Exercise per Session: 0 min  Stress: No Stress Concern Present  . Feeling of Stress : Not at all  Social Connections: Moderately Isolated  . Frequency of Communication with Friends and Family: More than three times a week  . Frequency of Social Gatherings with Friends and Family: Twice a week  . Attends Religious Services: Never  . Active Member of Clubs or Organizations: No  . Attends Banker Meetings: Never  . Marital Status: Widowed    Outpatient Encounter Medications as of 08/09/2019  Medication Sig  . aspirin 81 MG chewable tablet Chew 1 tablet by mouth daily.  . Calcium-Vitamin D-Vitamin K (VIACTIV CALCIUM PLUS D) 650-12.5-40 MG-MCG-MCG CHEW Chew 1 each by mouth 2 (two) times daily.  . cetirizine (ZYRTEC) 10 MG tablet Take 10 mg by mouth daily.  . fluorouracil (EFUDEX) 5 % cream   . losartan-hydrochlorothiazide (HYZAAR) 100-25 MG tablet Take 1 tablet by mouth daily.  . metoprolol succinate (TOPROL-XL) 100 MG 24 hr tablet Take 1 tablet (100 mg total) by mouth daily. Take with or immediately following a meal.  . Olopatadine HCl (PATADAY) 0.2 % SOLN Apply 1 drop to eye daily. Both eyes  . ondansetron (ZOFRAN) 4 MG tablet Take 1 tablet (4 mg total) by mouth every 8 (eight) hours as needed for nausea or vomiting.  . pantoprazole (PROTONIX) 40 MG tablet TAKE 1 TABLET DAILY  . pravastatin (PRAVACHOL) 20 MG tablet Take 1 tablet (20 mg total) by mouth daily.  Marland Kitchen PREBIOTIC PRODUCT PO Take by mouth daily.   Marland Kitchen albuterol (VENTOLIN HFA) 108 (90 Base) MCG/ACT inhaler Inhale 2 puffs into the lungs every 6 (six) hours as needed for wheezing or shortness of breath. (Patient not taking: Reported on 08/09/2019)   No facility-administered encounter medications on file as of 08/09/2019.     Activities of Daily Living In your present state of health, do you have any difficulty performing the following activities: 08/09/2019 02/15/2019  Hearing? N N  Vision? N Y  Difficulty concentrating or making decisions? N N  Walking or climbing stairs? Y Y  Comment Due to knee pains. -  Dressing or bathing? N N  Doing errands, shopping? N N  Preparing Food and eating ? N -  Using the Toilet? N -  In the past six months, have you accidently leaked urine? N -  Do you have problems with loss of bowel control? N -  Managing your Medications? N -  Managing your Finances? N -  Housekeeping or managing your Housekeeping?  N -  Some recent data might be hidden    Patient Care Team: Erasmo Downer, MD as PCP - General (Family Medicine) [facility], [facility] (Dermatology)    Assessment:   This is a routine wellness examination for Odalis.  Exercise Activities and Dietary recommendations Current Exercise Habits: The patient does not participate in regular exercise at present, Exercise limited by: orthopedic condition(s)  Goals    . Exercise 3x per week (30 min per time)     [facility] and exercise 5 days a week for 30 minutes or more.        Fall Risk: Fall Risk  08/09/2019 02/15/2019 07/18/2018 06/06/2017 01/13/2017  Falls in the past year? 0 0 0 No No  Number falls in past yr: 0 0 - - -  Injury with Fall? 0 0 - - -  Follow up - Falls evaluation completed - - -    FALL RISK PREVENTION PERTAINING TO THE HOME:  Any stairs in or around the home? No  If so, are there any without handrails? N/A  Home free of loose throw rugs in walkways, pet beds, electrical cords, etc? Yes  Adequate lighting in your home to reduce risk of falls? Yes   ASSISTIVE DEVICES UTILIZED TO PREVENT FALLS:  Life alert? No  Use of a cane, walker or w/c? Yes  Grab bars in the bathroom? Yes  Shower chair or bench in shower? Yes  Elevated toilet seat or a  handicapped toilet? Yes    TIMED UP AND GO:  Was the test performed? No .    Depression Screen PHQ 2/9 Scores 08/09/2019 02/15/2019 07/18/2018 06/06/2017  PHQ - 2 Score 0 0 0 0  PHQ- 9 Score - 0 - -     Cognitive Function: Declined today.        Immunization History  Administered Date(s) Administered  . Fluad Quad(high Dose 65+) 02/15/2019  . Influenza, High Dose Seasonal PF 01/13/2017, 02/14/2018  . Pneumococcal Conjugate-13 06/07/2014  . Pneumococcal Polysaccharide-23 06/23/2015  . Tdap 03/17/2016    Qualifies for Shingles Vaccine? Yes . Due for Shingrix. Pt has been advised to call insurance company to determine out of pocket expense. Advised may also receive vaccine at local pharmacy or Health Dept. Verbalized acceptance and understanding.  Tdap: Up to date  Flu Vaccine: Up to date  Pneumococcal Vaccine: Completed series  Screening Tests Health Maintenance  Topic Date Due  . COVID-19 Vaccine (1) Never done  . MAMMOGRAM  08/08/2020 (Originally 10/30/2016)  . INFLUENZA VACCINE  11/18/2019  . COLONOSCOPY  09/04/2020  . DEXA SCAN  06/24/2021  . TETANUS/TDAP  03/17/2026  . Hepatitis C Screening  Completed  . PNA vac Low Risk Adult  Completed    Cancer Screenings:  Colorectal Screening: Completed 09/05/15. Repeat every 5 years.   Mammogram: Completed 10/31/14. Due to implants pt declined receiving mammograms anymore.  Bone Density: Completed 06/25/19. Results reflect OSTEOPENIA. Repeat every 5 years.   Lung Cancer Screening: (Low Dose CT Chest recommended if Age 32-80 years, 30 pack-year currently smoking OR have quit w/in 15years.) does not qualify.   Additional Screening:  Hepatitis C Screening: Up to date  Vision Screening: Recommended annual ophthalmology exams for early detection of glaucoma and other disorders of the eye.  Dental Screening: Recommended annual dental exams for proper oral hygiene  Community Resource Referral:  CRR required this visit?   No  Plan:  I have personally reviewed and addressed the Medicare Annual Wellness questionnaire and have noted the following in the patient's chart:  A. Medical and social history B. Use of alcohol, tobacco or illicit drugs  C. Current medications and supplements D. Functional ability and status E.  Nutritional status F.  Physical activity G. Advance directives H. List of other physicians I.  Hospitalizations, surgeries, and ER visits in previous 12 months J.  [place] such as hearing and vision if needed, cognitive and depression L. Referrals and appointments   In addition, I have reviewed and discussed with patient certain preventive protocols, quality metrics, and best practice recommendations. A written personalized care plan for preventive services as well as general preventive health recommendations were provided to patient. Nurse Health Advisor  Signed,    Rhandi Despain Wade, [place]  [id number] Nurse Health Advisor   Nurse Notes: Pt declined receiving mammograms anymore.

## 2019-08-09 ENCOUNTER — Ambulatory Visit (INDEPENDENT_AMBULATORY_CARE_PROVIDER_SITE_OTHER): Payer: Medicare Other

## 2019-08-09 ENCOUNTER — Other Ambulatory Visit: Payer: Self-pay

## 2019-08-09 DIAGNOSIS — Z0000 Encounter for general adult medical examination without abnormal findings: Secondary | ICD-10-CM

## 2019-08-09 NOTE — Patient Instructions (Signed)
[name] , Thank you for taking time to come for your Medicare Wellness Visit. I appreciate your ongoing commitment to your health goals. Please review the following plan we discussed and let me know if I can assist you in the future.   Screening recommendations/referrals: Colonoscopy: Up to date, due 08/2021 Mammogram: No longer required. Bone Density: Up to date, due 06/2021 Recommended yearly ophthalmology/optometry visit for glaucoma screening and checkup Recommended yearly dental visit for hygiene and checkup  Vaccinations: Influenza vaccine: Up to date Pneumococcal vaccine: Completed series Tdap vaccine: Up to date Shingles vaccine: Pt declines today.     Advanced directives: Advance directive discussed with you today. Even though you declined this today please call our office should you change your mind and we can give you the proper paperwork for you to fill out.  Conditions/risks identified: Recommend to start exercising 3 days a week for at least 30 minutes at a time.  Next appointment: 08/16/19 @ 9:40 AM with Dr Beryle Flock    Preventive Care 65 Years and Older, Female Preventive care refers to lifestyle choices and visits with your health care provider that can promote health and wellness. What does preventive care include?  A yearly physical exam. This is also called an annual well check.  Dental exams once or twice a year.  Routine eye exams. Ask your health care provider how often you should have your eyes checked.  Personal lifestyle choices, including:  Daily care of your teeth and gums.  Regular physical activity.  Eating a healthy diet.  Avoiding tobacco and drug use.  Limiting alcohol use.  Practicing safe sex.  Taking low-dose aspirin every day.  Taking vitamin and mineral supplements as recommended by your health care provider. What happens during an annual well check? The services and screenings done by your health care provider during your  annual well check will depend on your age, overall health, lifestyle risk factors, and family history of disease. Counseling  Your health care provider may ask you questions about your:  Alcohol use.  Tobacco use.  Drug use.  Emotional well-being.  Home and relationship well-being.  Sexual activity.  Eating habits.  History of falls.  Memory and ability to understand (cognition).  Work and work Astronomer.  Reproductive health. Screening  You may have the following tests or measurements:  Height, weight, and BMI.  Blood pressure.  Lipid and cholesterol levels. These may be checked every 5 years, or more frequently if you are over 68 years old.  Skin check.  Lung cancer screening. You may have this screening every year starting at age 73 if you have a 30-pack-year history of smoking and currently smoke or have quit within the past 15 years.  Fecal occult blood test (FOBT) of the stool. You may have this test every year starting at age 27.  Flexible sigmoidoscopy or colonoscopy. You may have a sigmoidoscopy every 5 years or a colonoscopy every 10 years starting at age 40.  Hepatitis C blood test.  Hepatitis B blood test.  Sexually transmitted disease (STD) testing.  Diabetes screening. This is done by checking your blood sugar (glucose) after you have not eaten for a while (fasting). You may have this done every 1-3 years.  Bone density scan. This is done to screen for osteoporosis. You may have this done starting at age 60.  Mammogram. This may be done every 1-2 years. Talk to your health care provider about how often you should have regular mammograms. Talk with  your health care provider about your test results, treatment options, and if necessary, the need for more tests. Vaccines  Your health care provider may recommend certain vaccines, such as:  Influenza vaccine. This is recommended every year.  Tetanus, diphtheria, and acellular pertussis (Tdap, Td)  vaccine. You may need a Td booster every 10 years.  Zoster vaccine. You may need this after age 71.  Pneumococcal 13-valent conjugate (PCV13) vaccine. One dose is recommended after age 27.  Pneumococcal polysaccharide (PPSV23) vaccine. One dose is recommended after age 16. Talk to your health care provider about which screenings and vaccines you need and how often you need them. This information is not intended to replace advice given to you by your health care provider. Make sure you discuss any questions you have with your health care provider. Document Released: 05/02/2015 Document Revised: 12/24/2015 Document Reviewed: 02/04/2015 Elsevier Interactive Patient Education  2017 [place] Prevention in the Home Falls can cause injuries. They can happen to people of all ages. There are many things you can do to make your home safe and to help prevent falls. What can I do on the outside of my home?  Regularly fix the edges of walkways and driveways and fix any cracks.  Remove anything that might make you trip as you walk through a door, such as a raised step or threshold.  Trim any bushes or trees on the path to your home.  Use bright outdoor lighting.  Clear any walking paths of anything that might make someone trip, such as rocks or tools.  Regularly check to see if handrails are loose or broken. Make sure that both sides of any steps have handrails.  Any raised decks and porches should have guardrails on the edges.  Have any leaves, snow, or ice cleared regularly.  Use sand or salt on walking paths during winter.  Clean up any spills in your garage right away. This includes oil or grease spills. What can I do in the bathroom?  Use night lights.  Install grab bars by the toilet and in the tub and shower. Do not use towel bars as grab bars.  Use non-skid mats or decals in the tub or shower.  If you need to sit down in the shower, use a plastic, non-slip  stool.  Keep the floor dry. Clean up any water that spills on the floor as soon as it happens.  Remove soap buildup in the tub or shower regularly.  Attach bath mats securely with double-sided non-slip rug tape.  Do not have throw rugs and other things on the floor that can make you trip. What can I do in the bedroom?  Use night lights.  Make sure that you have a light by your bed that is easy to reach.  Do not use any sheets or blankets that are too big for your bed. They should not hang down onto the floor.  Have a firm chair that has side arms. You can use this for support while you get dressed.  Do not have throw rugs and other things on the floor that can make you trip. What can I do in the kitchen?  Clean up any spills right away.  Avoid walking on wet floors.  Keep items that you use a lot in easy-to-reach places.  If you need to reach something above you, use a strong step stool that has a grab bar.  Keep electrical cords out of the way.  Do not  use floor polish or wax that makes floors slippery. If you must use wax, use non-skid floor wax.  Do not have throw rugs and other things on the floor that can make you trip. What can I do with my stairs?  Do not leave any items on the stairs.  Make sure that there are handrails on both sides of the stairs and use them. Fix handrails that are broken or loose. Make sure that handrails are as long as the stairways.  Check any carpeting to make sure that it is firmly attached to the stairs. Fix any carpet that is loose or worn.  Avoid having throw rugs at the top or bottom of the stairs. If you do have throw rugs, attach them to the floor with carpet tape.  Make sure that you have a light switch at the top of the stairs and the bottom of the stairs. If you do not have them, ask someone to add them for you. What else can I do to help prevent falls?  Wear shoes that:  Do not have high heels.  Have rubber bottoms.  Are  comfortable and fit you well.  Are closed at the toe. Do not wear sandals.  If you use a stepladder:  Make sure that it is fully opened. Do not climb a closed stepladder.  Make sure that both sides of the stepladder are locked into place.  Ask someone to hold it for you, if possible.  Clearly mark and make sure that you can see:  Any grab bars or handrails.  First and last steps.  Where the edge of each step is.  Use tools that help you move around (mobility aids) if they are needed. These include:  Canes.  Walkers.  Scooters.  Crutches.  Turn on the lights when you go into a dark area. Replace any light bulbs as soon as they burn out.  Set up your furniture so you have a clear path. Avoid moving your furniture around.  If any of your floors are uneven, fix them.  If there are any pets around you, be aware of where they are.  Review your medicines with your doctor. Some medicines can make you feel dizzy. This can increase your chance of falling. Ask your doctor what other things that you can do to help prevent falls. This information is not intended to replace advice given to you by your health care provider. Make sure you discuss any questions you have with your health care provider. Document Released: 01/30/2009 Document Revised: 09/11/2015 Document Reviewed: 05/10/2014 Elsevier Interactive Patient Education  2017 [employer].

## 2019-08-16 ENCOUNTER — Ambulatory Visit (INDEPENDENT_AMBULATORY_CARE_PROVIDER_SITE_OTHER): Payer: Medicare Other | Admitting: Family Medicine

## 2019-08-16 ENCOUNTER — Other Ambulatory Visit: Payer: Self-pay

## 2019-08-16 ENCOUNTER — Encounter: Payer: Self-pay | Admitting: Family Medicine

## 2019-08-16 VITALS — BP 130/70 | HR 66 | Temp 97.3°F | Resp 16 | Ht 67.0 in | Wt 201.0 lb

## 2019-08-16 DIAGNOSIS — E669 Obesity, unspecified: Secondary | ICD-10-CM | POA: Diagnosis not present

## 2019-08-16 DIAGNOSIS — Z6831 Body mass index (BMI) 31.0-31.9, adult: Secondary | ICD-10-CM

## 2019-08-16 DIAGNOSIS — K22719 Barrett's esophagus with dysplasia, unspecified: Secondary | ICD-10-CM

## 2019-08-16 DIAGNOSIS — M810 Age-related osteoporosis without current pathological fracture: Secondary | ICD-10-CM

## 2019-08-16 DIAGNOSIS — E66811 Obesity, class 1: Secondary | ICD-10-CM

## 2019-08-16 DIAGNOSIS — I10 Essential (primary) hypertension: Secondary | ICD-10-CM | POA: Diagnosis not present

## 2019-08-16 NOTE — Assessment & Plan Note (Addendum)
Stable Continue current medication Referred to GI for repeat EGD

## 2019-08-16 NOTE — Assessment & Plan Note (Addendum)
Bone density scan shows osteoporosis Recommend regular weight bearing exercise and adequate Ca (1200mg /day) and vitamin D (1000 units daily) via diet or supplement. Patient declines medications for osteoporosis Will recheck in 2 years.

## 2019-08-16 NOTE — Assessment & Plan Note (Signed)
Well controlled Continue current medications Recheck metabolic panel F/u in 6 months  

## 2019-08-16 NOTE — Assessment & Plan Note (Signed)
Discussed importance of healthy weight management Discussed diet and exercise  

## 2019-08-16 NOTE — Progress Notes (Signed)
Established patient visit   Patient: Kara French   DOB: 1950-12-14   69 y.o. Female  MRN: [id number] Visit Date: 08/16/2019  I,Sulibeya S Dimas,acting as a scribe for Shirlee Latch, MD.,have documented all relevant documentation on the behalf of Shirlee Latch, MD,as directed by  Shirlee Latch, MD while in the presence of Shirlee Latch, MD.  Today's healthcare provider: Shirlee Latch, MD   Chief Complaint  Patient presents with  . Hypertension   Subjective    HPI Hypertension, follow-up  BP Readings from Last 3 Encounters:  08/16/19 130/70  02/15/19 128/78  09/27/18 125/71   Wt Readings from Last 3 Encounters:  08/16/19 201 lb (91.2 kg)  02/15/19 199 lb 9.6 oz (90.5 kg)  09/27/18 189 lb 11.2 oz (86 kg)     She was last seen for hypertension 6 months ago.  BP at that visit was 128/78. Management since that visit includes no changes.  She reports excellent compliance with treatment. She is not having side effects.  She is following a Regular diet. She is not exercising. She does not smoke.  Use of agents associated with hypertension: none.   Outside blood pressures are stable. Symptoms: No chest pain No chest pressure No palpitations No dyspnea No orthopnea No paroxysmal nocturnal dyspnea No lower extremity edema No syncope   Pertinent labs: Lab Results  Component Value Date   CHOL 173 02/26/2019   HDL 44 02/26/2019   LDLCALC 118 (H) 02/26/2019   TRIG 57 02/26/2019   CHOLHDL 3.9 02/26/2019   Lab Results  Component Value Date   NA 141 02/26/2019   K 4.1 02/26/2019   CO2 25 02/26/2019   GLUCOSE 122 (H) 02/26/2019   BUN 13 02/26/2019   CREATININE 0.86 02/26/2019   CALCIUM 9.2 02/26/2019   GFRNONAA 69 02/26/2019   GFRAA 79 02/26/2019     The 10-year ASCVD risk score Denman George DC Jr., et al., 2013) is: 13%   ---------------------------------------------------------------------------------------------------   Social  History   Tobacco Use  . Smoking status: Former Smoker    Packs/day: 0.50    Years: 15.00    Pack years: 7.50    Types: Cigarettes    Quit date: 04/18/1997    Years since quitting: 22.3  . Smokeless tobacco: Never Used  Substance Use Topics  . Alcohol use: No  . Drug use: No       Medications: Outpatient Medications Prior to Visit  Medication Sig  . aspirin 81 MG chewable tablet Chew 1 tablet by mouth daily.  . Calcium-Vitamin D-Vitamin K (VIACTIV CALCIUM PLUS D) 650-12.5-40 MG-MCG-MCG CHEW Chew 1 each by mouth 2 (two) times daily.  . cetirizine (ZYRTEC) 10 MG tablet Take 10 mg by mouth daily.  Marland Kitchen losartan-hydrochlorothiazide (HYZAAR) 100-25 MG tablet Take 1 tablet by mouth daily.  . metoprolol succinate (TOPROL-XL) 100 MG 24 hr tablet Take 1 tablet (100 mg total) by mouth daily. Take with or immediately following a meal.  . Olopatadine HCl (PATADAY) 0.2 % SOLN Apply 1 drop to eye daily. Both eyes  . ondansetron (ZOFRAN) 4 MG tablet Take 1 tablet (4 mg total) by mouth every 8 (eight) hours as needed for nausea or vomiting.  . pantoprazole (PROTONIX) 40 MG tablet TAKE 1 TABLET DAILY  . pravastatin (PRAVACHOL) 20 MG tablet Take 1 tablet (20 mg total) by mouth daily.  Marland Kitchen PREBIOTIC PRODUCT PO Take by mouth daily.   Marland Kitchen albuterol (VENTOLIN HFA) 108 (90 Base) MCG/ACT inhaler Inhale 2 puffs  into the lungs every 6 (six) hours as needed for wheezing or shortness of breath. (Patient not taking: Reported on 08/09/2019)  . fluorouracil (EFUDEX) 5 % cream    No facility-administered medications prior to visit.    Review of Systems  Constitutional: Negative.   Respiratory: Negative.   Cardiovascular: Negative.     Last metabolic panel Lab Results  Component Value Date   GLUCOSE 122 (H) 02/26/2019   NA 141 02/26/2019   K 4.1 02/26/2019   CL 103 02/26/2019   CO2 25 02/26/2019   BUN 13 02/26/2019   CREATININE 0.86 02/26/2019   GFRNONAA 69 02/26/2019   GFRAA 79 02/26/2019   CALCIUM 9.2  02/26/2019   PROT 6.1 02/26/2019   ALBUMIN 4.2 02/26/2019   LABGLOB 1.9 02/26/2019   AGRATIO 2.2 02/26/2019   BILITOT 0.5 02/26/2019   ALKPHOS 64 02/26/2019   AST 16 02/26/2019   ALT 16 02/26/2019   Last lipids Lab Results  Component Value Date   CHOL 173 02/26/2019   HDL 44 02/26/2019   LDLCALC 118 (H) 02/26/2019   TRIG 57 02/26/2019   CHOLHDL 3.9 02/26/2019      Objective    BP 130/70   Pulse 66   Temp (!) 97.3 F (36.3 C) (Temporal)   Resp 16   Ht 5\' 7"  (1.702 m)   Wt 201 lb (91.2 kg)   BMI 31.48 kg/m  BP Readings from Last 3 Encounters:  08/16/19 130/70  02/15/19 128/78  09/27/18 125/71   Wt Readings from Last 3 Encounters:  08/16/19 201 lb (91.2 kg)  02/15/19 199 lb 9.6 oz (90.5 kg)  09/27/18 189 lb 11.2 oz (86 kg)      Physical Exam Vitals reviewed.  Constitutional:      General: She is not in acute distress.    Appearance: Normal appearance. She is well-developed.  HENT:     Head: Normocephalic and atraumatic.  Eyes:     General: No scleral icterus.    Conjunctiva/sclera: Conjunctivae normal.  Cardiovascular:     Rate and Rhythm: Normal rate and regular rhythm.     Pulses: Normal pulses.     Heart sounds: Normal heart sounds.  Pulmonary:     Effort: Pulmonary effort is normal. No respiratory distress.     Breath sounds: Normal breath sounds. No wheezing.  Musculoskeletal:     Right lower leg: No edema.     Left lower leg: No edema.  Skin:    General: Skin is warm and dry.     Findings: No rash.  Neurological:     Mental Status: She is alert and oriented to person, place, and time. Mental status is at baseline.  Psychiatric:        Mood and Affect: Mood normal.        Behavior: Behavior normal.      No results found for any visits on 08/16/19.  Assessment & Plan     Problem List Items Addressed This Visit      Cardiovascular and Mediastinum   Hypertension - Primary    Well controlled Continue current medications Recheck  metabolic panel F/u in 6 months       Relevant Orders   Basic metabolic panel     Digestive   Barrett's esophagus    Stable Continue current medication Referred to GI for repeat EGD      Relevant Orders   Ambulatory referral to Gastroenterology     Musculoskeletal and Integument   Osteoporosis  Bone density scan shows osteoporosis Recommend regular weight bearing exercise and adequate Ca (1200mg /day) and vitamin D (1000 units daily) via diet or supplement. Patient declines medications for osteoporosis Will recheck in 2 years.        Other   Obesity (BMI 30-39.9)    Discussed importance of healthy weight management Discussed diet and exercise           Return in about 6 months (around 02/15/2020) for CPE/AWV.      I, Lavon Paganini, MD, have reviewed all documentation for this visit. The documentation on 08/16/19 for the exam, diagnosis, procedures, and orders are all accurate and complete.   Jesper Stirewalt, Dionne Bucy, MD, MPH [facility]

## 2019-08-17 ENCOUNTER — Telehealth: Payer: Self-pay

## 2019-08-17 LAB — BASIC METABOLIC PANEL
BUN/Creatinine Ratio: 15 (ref 12–28)
BUN: 14 mg/dL (ref 8–27)
CO2: 24 mmol/L (ref 20–29)
Calcium: 9.3 mg/dL (ref 8.7–10.3)
Chloride: 102 mmol/L (ref 96–106)
Creatinine, Ser: 0.93 mg/dL (ref 0.57–1.00)
GFR calc Af Amer: 72 mL/min/{1.73_m2} (ref >59)
GFR calc non Af Amer: 62 mL/min/{1.73_m2} (ref >59)
Glucose: 113 mg/dL — ABNORMAL HIGH (ref 65–99)
Potassium: 4 mmol/L (ref 3.5–5.2)
Sodium: 140 mmol/L (ref 134–144)

## 2019-08-17 NOTE — Telephone Encounter (Signed)
-----   Message from Erasmo Downer, MD sent at 08/17/2019  9:28 AM EDT ----- Normal labs - blood sugar is normal when non-fasting

## 2019-08-17 NOTE — Telephone Encounter (Signed)
OK. Should get A1c with next labs for hyperlycemia

## 2019-08-17 NOTE — Telephone Encounter (Signed)
Pt advised.  She states she was fasting for her lab work.     Thanks,   -Vernona Rieger

## 2019-09-07 NOTE — Progress Notes (Signed)
See note from Daniel Le, medical student, attested by me from same date of service  

## 2019-09-10 ENCOUNTER — Encounter: Payer: Self-pay | Admitting: Family Medicine

## 2019-09-10 ENCOUNTER — Ambulatory Visit (INDEPENDENT_AMBULATORY_CARE_PROVIDER_SITE_OTHER): Payer: Medicare Other | Admitting: Family Medicine

## 2019-09-10 ENCOUNTER — Other Ambulatory Visit: Payer: Self-pay

## 2019-09-10 VITALS — BP 134/76 | HR 75 | Temp 97.1°F | Resp 16 | Ht 67.0 in | Wt 199.6 lb

## 2019-09-10 DIAGNOSIS — M7989 Other specified soft tissue disorders: Secondary | ICD-10-CM | POA: Diagnosis not present

## 2019-09-10 NOTE — Patient Instructions (Signed)
DASH Eating Plan DASH stands for "Dietary Approaches to Stop Hypertension." The DASH eating plan is a healthy eating plan that has been shown to reduce high blood pressure (hypertension). It may also reduce your risk for type 2 diabetes, heart disease, and stroke. The DASH eating plan may also help with weight loss. What are tips for following this plan?  General guidelines  Avoid eating more than 2,300 mg (milligrams) of salt (sodium) a day. If you have hypertension, you may need to reduce your sodium intake to 1,500 mg a day.  Limit alcohol intake to no more than 1 drink a day for nonpregnant women and 2 drinks a day for men. One drink equals 12 oz of beer, 5 oz of wine, or 1 oz of hard liquor.  Work with your health care provider to maintain a healthy body weight or to lose weight. Ask what an ideal weight is for you.  Get at least 30 minutes of exercise that causes your heart to beat faster (aerobic exercise) most days of the week. Activities may include walking, swimming, or biking.  Work with your health care provider or diet and nutrition specialist (dietitian) to adjust your eating plan to your individual calorie needs. Reading food labels   Check food labels for the amount of sodium per serving. Choose foods with less than 5 percent of the Daily Value of sodium. Generally, foods with less than 300 mg of sodium per serving fit into this eating plan.  To find whole grains, look for the word "whole" as the first word in the ingredient list. Shopping  Buy products labeled as "low-sodium" or "no salt added."  Buy fresh foods. Avoid canned foods and premade or frozen meals. Cooking  Avoid adding salt when cooking. Use salt-free seasonings or herbs instead of table salt or sea salt. Check with your health care provider or pharmacist before using salt substitutes.  Do not fry foods. Cook foods using healthy methods such as baking, boiling, grilling, and broiling instead.  Cook with  heart-healthy oils, such as olive, canola, soybean, or sunflower oil. Meal planning  Eat a balanced diet that includes: ? 5 or more servings of fruits and vegetables each day. At each meal, try to fill half of your plate with fruits and vegetables. ? Up to 6-8 servings of whole grains each day. ? Less than 6 oz of lean meat, poultry, or fish each day. A 3-oz serving of meat is about the same size as a deck of cards. One egg equals 1 oz. ? 2 servings of low-fat dairy each day. ? A serving of nuts, seeds, or beans 5 times each week. ? Heart-healthy fats. Healthy fats called Omega-3 fatty acids are found in foods such as flaxseeds and coldwater fish, like sardines, salmon, and mackerel.  Limit how much you eat of the following: ? Canned or prepackaged foods. ? Food that is high in trans fat, such as fried foods. ? Food that is high in saturated fat, such as fatty meat. ? Sweets, desserts, sugary drinks, and other foods with added sugar. ? Full-fat dairy products.  Do not salt foods before eating.  Try to eat at least 2 vegetarian meals each week.  Eat more home-cooked food and less restaurant, buffet, and fast food.  When eating at a restaurant, ask that your food be prepared with less salt or no salt, if possible. What foods are recommended? The items listed may not be a complete list. Talk with your dietitian about   what dietary choices are best for you. Grains Whole-grain or whole-wheat bread. Whole-grain or whole-wheat pasta. Brown rice. Oatmeal. Quinoa. Bulgur. Whole-grain and low-sodium cereals. Pita bread. Low-fat, low-sodium crackers. Whole-wheat flour tortillas. Vegetables Fresh or frozen vegetables (raw, steamed, roasted, or grilled). Low-sodium or reduced-sodium tomato and vegetable juice. Low-sodium or reduced-sodium tomato sauce and tomato paste. Low-sodium or reduced-sodium canned vegetables. Fruits All fresh, dried, or frozen fruit. Canned fruit in natural juice (without  added sugar). Meat and other protein foods Skinless chicken or turkey. Ground chicken or turkey. Pork with fat trimmed off. Fish and seafood. Egg whites. Dried beans, peas, or lentils. Unsalted nuts, nut butters, and seeds. Unsalted canned beans. Lean cuts of beef with fat trimmed off. Low-sodium, lean deli meat. Dairy Low-fat (1%) or fat-free (skim) milk. Fat-free, low-fat, or reduced-fat cheeses. Nonfat, low-sodium ricotta or cottage cheese. Low-fat or nonfat yogurt. Low-fat, low-sodium cheese. Fats and oils Soft margarine without trans fats. Vegetable oil. Low-fat, reduced-fat, or light mayonnaise and salad dressings (reduced-sodium). Canola, safflower, olive, soybean, and sunflower oils. Avocado. Seasoning and other foods Herbs. Spices. Seasoning mixes without salt. Unsalted popcorn and pretzels. Fat-free sweets. What foods are not recommended? The items listed may not be a complete list. Talk with your dietitian about what dietary choices are best for you. Grains Baked goods made with fat, such as croissants, muffins, or some breads. Dry pasta or rice meal packs. Vegetables Creamed or fried vegetables. Vegetables in a cheese sauce. Regular canned vegetables (not low-sodium or reduced-sodium). Regular canned tomato sauce and paste (not low-sodium or reduced-sodium). Regular tomato and vegetable juice (not low-sodium or reduced-sodium). Pickles. Olives. Fruits Canned fruit in a light or heavy syrup. Fried fruit. Fruit in cream or butter sauce. Meat and other protein foods Fatty cuts of meat. Ribs. Fried meat. Bacon. Sausage. Bologna and other processed lunch meats. Salami. Fatback. Hotdogs. Bratwurst. Salted nuts and seeds. Canned beans with added salt. Canned or smoked fish. Whole eggs or egg yolks. Chicken or turkey with skin. Dairy Whole or 2% milk, cream, and half-and-half. Whole or full-fat cream cheese. Whole-fat or sweetened yogurt. Full-fat cheese. Nondairy creamers. Whipped toppings.  Processed cheese and cheese spreads. Fats and oils Butter. Stick margarine. Lard. Shortening. Ghee. Bacon fat. Tropical oils, such as coconut, palm kernel, or palm oil. Seasoning and other foods Salted popcorn and pretzels. Onion salt, garlic salt, seasoned salt, table salt, and sea salt. Worcestershire sauce. Tartar sauce. Barbecue sauce. Teriyaki sauce. Soy sauce, including reduced-sodium. Steak sauce. Canned and packaged gravies. Fish sauce. Oyster sauce. Cocktail sauce. Horseradish that you find on the shelf. Ketchup. Mustard. Meat flavorings and tenderizers. Bouillon cubes. Hot sauce and Tabasco sauce. Premade or packaged marinades. Premade or packaged taco seasonings. Relishes. Regular salad dressings. Where to find more information:  National Heart, Lung, and Blood Institute: www.nhlbi.nih.gov  American Heart Association: www.heart.org Summary  The DASH eating plan is a healthy eating plan that has been shown to reduce high blood pressure (hypertension). It may also reduce your risk for type 2 diabetes, heart disease, and stroke.  With the DASH eating plan, you should limit salt (sodium) intake to 2,300 mg a day. If you have hypertension, you may need to reduce your sodium intake to 1,500 mg a day.  When on the DASH eating plan, aim to eat more fresh fruits and vegetables, whole grains, lean proteins, low-fat dairy, and heart-healthy fats.  Work with your health care provider or diet and nutrition specialist (dietitian) to adjust your eating plan to your   individual calorie needs. This information is not intended to replace advice given to you by your health care provider. Make sure you discuss any questions you have with your health care provider. Document Revised: 03/18/2017 Document Reviewed: 03/29/2016 Elsevier Patient Education  2020 [employer]  

## 2019-09-10 NOTE — Progress Notes (Signed)
Established patient visit   Patient: Kara French   DOB: 07-03-1950   69 y.o. Female  MRN: [id number] Visit Date: 09/10/2019  Today's healthcare provider: Shirlee Latch, MD   Chief Complaint  Patient presents with  . Leg Swelling   Subjective    HPI  Leg Swelling / Concerns for CHF  Pt states that she had progressive swelling in her left foot for the last week Pt states swelling was at the worst 4 days ago Pt denies associated pain, difficulty walking, pain while walking, numbness, or tingling Pt believes symptoms are due to diet Pt has been eating a lot of processed food and high sodium Pt states she eats a lot of lunch meats and cheeses and crackers Pt stopped eating a high sodium diet on Thursday Symptoms of leg swelling have gradually decreased Pt does not complain of any symptoms of swelling today  Pt has concerns of CHF given additional symptoms of dyspnea and orthopnea Pt has history of mitral valve prolapse Pt last saw a cardiologist February 2019 for SOB and chest pain Symptoms at that last visit were chronic recurrent and reasonably stable according to cardiology Pt states symptoms of dyspnea are related to her allergies. Pt states that when allergies and congestion are clear, pt has no symptoms of dyspnea Pt denies dyspnea on exertion, chest pain, chest pressure, coughing, wheezing, pain while breathing Pt states symptoms of orthopnea started over 30 years ago Pt sleeps in a inclined position; sleeping flat causes symptoms of chest pressure and dyspnea Pt states that she does not sleep well at night. Pt states family members attest that she snores at night Pt states she has chronic fatigue   ------------------------------------------------------------------------------------     Social History   Tobacco Use  . Smoking status: Former Smoker    Packs/day: 0.50    Years: 15.00    Pack years: 7.50    Types: Cigarettes    Quit date:  04/18/1997    Years since quitting: 22.4  . Smokeless tobacco: Never Used  Substance Use Topics  . Alcohol use: No  . Drug use: No   Social History   Socioeconomic History  . Marital status: Widowed    Spouse name: Not on file  . Number of children: 1  . Years of education: post-graduate  . Highest education level: Master's degree (e.g., MA, MS, MEng, MEd, MSW, MBA)  Occupational History  . Occupation: retired    Comment: Human resources officer  Tobacco Use  . Smoking status: Former Smoker    Packs/day: 0.50    Years: 15.00    Pack years: 7.50    Types: Cigarettes    Quit date: 04/18/1997    Years since quitting: 22.4  . Smokeless tobacco: Never Used  Substance and Sexual Activity  . Alcohol use: No  . Drug use: No  . Sexual activity: Not Currently  Other Topics Concern  . Not on file  Social History Narrative  . Not on file   Social Determinants of Health   Financial Resource Strain: Low Risk   . Difficulty of Paying Living Expenses: Not hard at all  Food Insecurity: No Food Insecurity  . Worried About Programme researcher, broadcasting/film/video in the Last Year: Never true  . Ran Out of Food in the Last Year: Never true  Transportation Needs: No Transportation Needs  . Lack of Transportation (Medical): No  . Lack of Transportation (Non-Medical): No  Physical Activity: Inactive  . Days of Exercise  per Week: 0 days  . Minutes of Exercise per Session: 0 min  Stress: No Stress Concern Present  . Feeling of Stress : Not at all  Social Connections: Moderately Isolated  . Frequency of Communication with Friends and Family: More than three times a week  . Frequency of Social Gatherings with Friends and Family: Twice a week  . Attends Religious Services: Never  . Active Member of Clubs or Organizations: No  . Attends Banker Meetings: Never  . Marital Status: Widowed  Intimate Partner Violence: Not At Risk  . Fear of Current or Ex-Partner: No  . Emotionally Abused: No  .  Physically Abused: No  . Sexually Abused: No       Medications: Outpatient Medications Prior to Visit  Medication Sig  . aspirin 81 MG chewable tablet Chew 1 tablet by mouth daily.  . Calcium-Vitamin D-Vitamin K (VIACTIV CALCIUM PLUS D) 650-12.5-40 MG-MCG-MCG CHEW Chew 1 each by mouth 2 (two) times daily.  . cetirizine (ZYRTEC) 10 MG tablet Take 10 mg by mouth daily.  Marland Kitchen losartan-hydrochlorothiazide (HYZAAR) 100-25 MG tablet Take 1 tablet by mouth daily.  . metoprolol succinate (TOPROL-XL) 100 MG 24 hr tablet Take 1 tablet (100 mg total) by mouth daily. Take with or immediately following a meal.  . Olopatadine HCl (PATADAY) 0.2 % SOLN Apply 1 drop to eye daily. Both eyes  . ondansetron (ZOFRAN) 4 MG tablet Take 1 tablet (4 mg total) by mouth every 8 (eight) hours as needed for nausea or vomiting.  . pantoprazole (PROTONIX) 40 MG tablet TAKE 1 TABLET DAILY  . pravastatin (PRAVACHOL) 20 MG tablet Take 1 tablet (20 mg total) by mouth daily.  Marland Kitchen PREBIOTIC PRODUCT PO Take by mouth daily.   . [DISCONTINUED] albuterol (VENTOLIN HFA) 108 (90 Base) MCG/ACT inhaler Inhale 2 puffs into the lungs every 6 (six) hours as needed for wheezing or shortness of breath. (Patient not taking: Reported on 08/09/2019)  . [DISCONTINUED] fluorouracil (EFUDEX) 5 % cream    No facility-administered medications prior to visit.    Review of Systems  Constitutional: Positive for fatigue. Negative for chills, diaphoresis, fever and unexpected weight change.  HENT: Positive for congestion and rhinorrhea. Negative for sinus pressure, sinus pain, sneezing and sore throat.   Eyes: Negative.   Respiratory: Positive for shortness of breath. Negative for apnea, cough, choking, chest tightness, wheezing and stridor.   Cardiovascular: Positive for leg swelling. Negative for chest pain and palpitations.  Gastrointestinal: Negative.   Endocrine: Negative.   Genitourinary: Negative.   Musculoskeletal: Negative.   Skin: Negative.    Allergic/Immunologic: Negative.   Neurological: Negative.   Hematological: Negative.   Psychiatric/Behavioral: Negative.     Last CBC Lab Results  Component Value Date   WBC 4.8 02/26/2019   HGB 12.7 02/26/2019   HCT 37.7 02/26/2019   MCV 84 02/26/2019   MCH 28.3 02/26/2019   RDW 13.2 02/26/2019   PLT 263 02/26/2019   Last metabolic panel Lab Results  Component Value Date   GLUCOSE 113 (H) 08/16/2019   NA 140 08/16/2019   K 4.0 08/16/2019   CL 102 08/16/2019   CO2 24 08/16/2019   BUN 14 08/16/2019   CREATININE 0.93 08/16/2019   GFRNONAA 62 08/16/2019   GFRAA 72 08/16/2019   CALCIUM 9.3 08/16/2019   PROT 6.1 02/26/2019   ALBUMIN 4.2 02/26/2019   LABGLOB 1.9 02/26/2019   AGRATIO 2.2 02/26/2019   BILITOT 0.5 02/26/2019   ALKPHOS 64 02/26/2019  AST 16 02/26/2019   ALT 16 02/26/2019   Last lipids Lab Results  Component Value Date   CHOL 173 02/26/2019   HDL 44 02/26/2019   LDLCALC 118 (H) 02/26/2019   TRIG 57 02/26/2019   CHOLHDL 3.9 02/26/2019      Objective    BP 134/76 (BP Location: Left Arm, Patient Position: Sitting, Cuff Size: Large)   Pulse 75   Temp (!) 97.1 F (36.2 C) (Temporal)   Resp 16   Ht 5\' 7"  (1.702 m)   Wt 199 lb 9.6 oz (90.5 kg)   SpO2 98%   BMI 31.26 kg/m  BP Readings from Last 3 Encounters:  09/10/19 134/76  08/16/19 130/70  02/15/19 128/78   Wt Readings from Last 3 Encounters:  09/10/19 199 lb 9.6 oz (90.5 kg)  08/16/19 201 lb (91.2 kg)  02/15/19 199 lb 9.6 oz (90.5 kg)      Physical Exam Constitutional:      Appearance: Normal appearance. She is obese.  HENT:     Head: Normocephalic and atraumatic.     Mouth/Throat:     Mouth: Mucous membranes are moist.     Pharynx: Oropharynx is clear.  Cardiovascular:     Rate and Rhythm: Normal rate and regular rhythm.     Chest Wall: PMI is not displaced. No thrill.     Pulses: Normal pulses.          Carotid pulses are 2+ on the right side and 2+ on the left side.       Radial pulses are 2+ on the right side and 2+ on the left side.       Dorsalis pedis pulses are 2+ on the right side and 2+ on the left side.       Posterior tibial pulses are 2+ on the right side and 2+ on the left side.     Heart sounds: Murmur (mid systolic click heard best at apex while pt is leaning forward) present. No gallop. No S3 or S4 sounds.   Pulmonary:     Effort: Pulmonary effort is normal. No tachypnea, bradypnea, accessory muscle usage, prolonged expiration, respiratory distress or retractions.     Breath sounds: Normal breath sounds. No stridor, decreased air movement or transmitted upper airway sounds. No decreased breath sounds, wheezing, rhonchi or rales.  Abdominal:     General: Abdomen is protuberant. Bowel sounds are normal. There is distension. There is no abdominal bruit. There are no signs of injury.     Palpations: Abdomen is soft. There is fluid wave. There is no hepatomegaly, splenomegaly, mass or pulsatile mass.     Tenderness: There is no abdominal tenderness.  Musculoskeletal:     Right lower leg: No edema.     Left lower leg: No edema.  Skin:    General: Skin is warm and dry.     Capillary Refill: Capillary refill takes less than 2 seconds.  Neurological:     General: No focal deficit present.     Mental Status: She is alert and oriented to person, place, and time.  Psychiatric:        Mood and Affect: Mood normal.        Behavior: Behavior normal.     No results found for any visits on 09/10/19.  Assessment & Plan     1. Leg swelling Pt with gradual onset of leg swelling starting last week and symptoms at worse 4 days ago Pt had increased sodium intake within  the last week and decreased sodium intake 4 days ago Symptoms of swelling have resolved and pt has no swelling today Symptoms most consistent with increased sodium intake and water retention Symptoms unlikely due to CHF given symptoms have resolved and cardio and pulmonary exam show no signs of  fluid overload, however must rule out Symptoms unlikely due to medications given no recent changes Must rule out anemia as a cause of swelling Must rule out liver and kidney failure as a cause of swelling  Plan: Will check BNP to rule out heart failure Will check CBC to rule out anemia Will check CMP to rule out kidney and liver failure Discussed with pt the importance of low sodium diet for controlling swelling and hypertension Will continue taking zertec and flonase for allergies symptoms Will consider ordering echocardiogram if symptoms of swelling continue to persist in 1 month   Return if symptoms worsen or fail to improve.      Terrill Mohr, Medical Student [facility]  [facility] [phone] (phone) [phone] (fax)  [facility]

## 2019-09-11 LAB — COMPREHENSIVE METABOLIC PANEL
ALT: 23 IU/L (ref 0–32)
AST: 23 IU/L (ref 0–40)
Albumin/Globulin Ratio: 2.1 (ref 1.2–2.2)
Albumin: 4.5 g/dL (ref 3.7–4.7)
Alkaline Phosphatase: 68 IU/L (ref 48–121)
BUN/Creatinine Ratio: 18 (ref 12–28)
BUN: 15 mg/dL (ref 8–27)
Bilirubin Total: 0.6 mg/dL (ref 0.0–1.2)
CO2: 22 mmol/L (ref 20–29)
Calcium: 9.4 mg/dL (ref 8.7–10.3)
Chloride: 97 mmol/L (ref 96–106)
Creatinine, Ser: 0.83 mg/dL (ref 0.57–1.00)
GFR calc Af Amer: 82 mL/min/{1.73_m2} (ref >59)
GFR calc non Af Amer: 71 mL/min/{1.73_m2} (ref >59)
Globulin, Total: 2.1 g/dL (ref 1.5–4.5)
Glucose: 123 mg/dL — ABNORMAL HIGH (ref 65–99)
Potassium: 3.4 mmol/L — ABNORMAL LOW (ref 3.5–5.2)
Sodium: 134 mmol/L (ref 134–144)
Total Protein: 6.6 g/dL (ref 6.0–8.5)

## 2019-09-11 LAB — CBC
Hematocrit: 41.9 % (ref 34.0–46.6)
Hemoglobin: 13.9 g/dL (ref 11.1–15.9)
MCH: 27.7 pg (ref 26.6–33.0)
MCHC: 33.2 g/dL (ref 31.5–35.7)
MCV: 84 fL (ref 79–97)
Platelets: 282 10*3/uL (ref 150–450)
RBC: 5.02 x10E6/uL (ref 3.77–5.28)
RDW: 13.5 % (ref 11.7–15.4)
WBC: 5.3 10*3/uL (ref 3.4–10.8)

## 2019-09-11 LAB — BRAIN NATRIURETIC PEPTIDE: BNP: 48.4 pg/mL (ref 0.0–100.0)

## 2019-09-12 ENCOUNTER — Telehealth: Payer: Self-pay

## 2019-09-12 NOTE — Telephone Encounter (Signed)
-----   Message from Erasmo Downer, MD sent at 09/12/2019  8:13 AM EDT ----- Stable labs.  Normal BNP with no signs of heart failure.

## 2019-09-12 NOTE — Telephone Encounter (Signed)
Pt advised.   Thanks,   -Everado Pillsbury  

## 2019-09-24 ENCOUNTER — Encounter: Payer: Self-pay | Admitting: Gastroenterology

## 2019-09-24 ENCOUNTER — Other Ambulatory Visit: Payer: Self-pay

## 2019-09-24 ENCOUNTER — Ambulatory Visit (INDEPENDENT_AMBULATORY_CARE_PROVIDER_SITE_OTHER): Payer: Medicare Other | Admitting: Gastroenterology

## 2019-09-24 VITALS — BP 161/83 | HR 83 | Ht 67.0 in | Wt 202.4 lb

## 2019-09-24 DIAGNOSIS — K22719 Barrett's esophagus with dysplasia, unspecified: Secondary | ICD-10-CM

## 2019-09-24 NOTE — Progress Notes (Signed)
Gastroenterology Consultation  Referring Provider:     Virginia Crews, MD Primary Care Physician:  Virginia Crews, MD Primary Gastroenterologist:  Dr. Allen Norris     Reason for Consultation:     Barrett's esophagus        HPI:   Kara French is a 69 y.o. y/o female referred for consultation & management of Barrett's esophagus by Dr. Brita Romp, Dionne Bucy, MD.  This patient comes in today with a history of Barrett's esophagus.  The patient also has a history of colonoscopy with pathology showing adenomatous colon polyps.  The patient was recommended to have a repeat colonoscopy in 5 years and that would be due next year but was also found to have Barrett's esophagus and has been told that she needed an EGD last year.  The patient has been well controlled with Protonix and states she only has acid breakthrough infrequently when she eats the wrong foods.  There is no report of any unexplained weight loss, fevers, chills, vomiting, black stools or bloody stools.  She also denies any dysphagia. The patient reports that she moved to [place] to be closer to family.  Past Medical History:  Diagnosis Date  . Allergic rhinitis   . Barrett's esophagus 2017   biopsy confirmed, related to GERD  . GERD (gastroesophageal reflux disease)   . Hyperlipidemia   . Hypertension   . Mitral valve prolapse    followed by cardiology  . Precancerous skin lesion     Past Surgical History:  Procedure Laterality Date  . BARTHOLIN GLAND CYST EXCISION  1975  . BREAST ENHANCEMENT SURGERY Bilateral 1975  . EYE SURGERY Left 02/28/2019   cataract surgery  . EYE SURGERY Right 03/28/2019   cataract surgery  . LAPAROSCOPIC CHOLECYSTECTOMY  2011  . LAPAROSCOPIC OOPHERECTOMY Right 1987   2/2 benign tumor  . TRANSANAL EXCISION OF RECTAL MASS WITH HEMORRHOID INJECTION  1989   hemorrhoid removal  . TUBAL LIGATION  1987    Prior to Admission medications   Medication Sig Start Date End Date Taking?  Authorizing Provider  aspirin 81 MG chewable tablet Chew 1 tablet by mouth daily.   Yes [provider]  Calcium-Vitamin D-Vitamin K (VIACTIV CALCIUM PLUS D) 650-12.5-40 MG-MCG-MCG CHEW Chew 1 each by mouth 2 (two) times daily.   Yes [provider]  cetirizine (ZYRTEC) 10 MG tablet Take 10 mg by mouth daily.   Yes [provider]  losartan-hydrochlorothiazide (HYZAAR) 100-25 MG tablet Take 1 tablet by mouth daily. 08/10/18  Yes Bacigalupo, Dionne Bucy, MD  metoprolol succinate (TOPROL-XL) 100 MG 24 hr tablet Take 1 tablet (100 mg total) by mouth daily. Take with or immediately following a meal. 08/10/18  Yes Bacigalupo, Dionne Bucy, MD  Olopatadine HCl (PATADAY) 0.2 % SOLN Apply 1 drop to eye daily. Both eyes   Yes [provider]  pantoprazole (PROTONIX) 40 MG tablet TAKE 1 TABLET DAILY 03/02/19  Yes Bacigalupo, Dionne Bucy, MD  pravastatin (PRAVACHOL) 20 MG tablet Take 1 tablet (20 mg total) by mouth daily. 03/01/19  Yes Bacigalupo, Dionne Bucy, MD  PREBIOTIC PRODUCT PO Take by mouth daily.    Yes [provider]    Family History  Problem Relation Age of Onset  . Dementia Mother 106  . Heart disease Mother 18       s/p 7 stents and CABG x3  . Colon cancer Sister 67  . Heart disease Brother  s./p stent placement  . Heart disease Brother        s/p stent placement  . Congestive Heart Failure Maternal Grandmother 82  . Heart attack Maternal Grandfather 66  . Healthy Paternal Grandmother   . Breast cancer Paternal Aunt 66     Social History   Tobacco Use  . Smoking status: Former Smoker    Packs/day: 0.50    Years: 15.00    Pack years: 7.50    Types: Cigarettes    Quit date: 04/18/1997    Years since quitting: 22.4  . Smokeless tobacco: Never Used  Substance Use Topics  . Alcohol use: No  . Drug use: No    Allergies as of 09/24/2019 - Review Complete 09/24/2019  Allergen Reaction Noted  . Aller-chlor  [chlorpheniramine]    .  Benzalkonium chloride Other (See Comments) 03/24/2017  . Neomycin-bacitracin zn-polymyx  12/11/2012  . Tape  12/11/2012  . Decongestant [pseudoephedrine hcl er] Itching 01/13/2017  . Iodinated diagnostic agents Rash and Hives 06/13/2016    Review of Systems:    All systems reviewed and negative except where noted in HPI.   Physical Exam:  BP (!) 161/83   Pulse 83   Ht 5\' 7"  (1.702 m)   Wt 202 lb 6.4 oz (91.8 kg)   BMI 31.70 kg/m  No LMP recorded. Patient is postmenopausal. General:   Alert,  Well-developed, well-nourished, pleasant and cooperative in NAD Head:  Normocephalic and atraumatic. Eyes:  Sclera clear, no icterus.   Conjunctiva pink. Ears:  Normal auditory acuity. Neck:  Supple; no masses or thyromegaly. Lungs:  Respirations even and unlabored.  Clear throughout to auscultation.   No wheezes, crackles, or rhonchi. No acute distress. Heart:  Regular rate and rhythm; positive diastolic murmur murmurs, clicks, rubs, or gallops. Abdomen:  Normal bowel sounds.  No bruits.  Soft, non-tender and non-distended without masses, hepatosplenomegaly or hernias noted.  No guarding or rebound tenderness.  Negative Carnett sign.   Rectal:  Deferred.  Pulses:  Normal pulses noted. Extremities:  No clubbing or edema.  No cyanosis. Neurologic:  Alert and oriented x3;  grossly normal neurologically. Skin:  Intact without significant lesions or rashes.  No jaundice. Lymph Nodes:  No significant cervical adenopathy. Psych:  Alert and cooperative. Normal mood and affect.  Imaging Studies: No results found.  Assessment and Plan:   Kara French is a 69 y.o. y/o female who comes in today with a history of Barrett's esophagus.  The patient has not had an upper endoscopy in 4 years.  The patient also has a history of adenomatous polyps.  The patient is due for her next colonoscopy next year.  The patient has been offered to do both procedures now but would like to hold off on the colonoscopy  until next year.  The patient will be set up for an EGD.  The patient has been explained the plan and agrees with it.    60, MD. Midge Minium    Note: This dictation was prepared with Dragon dictation along with smaller phrase technology. Any transcriptional errors that result from this process are unintentional.

## 2019-10-04 ENCOUNTER — Other Ambulatory Visit: Payer: Self-pay | Admitting: Family Medicine

## 2019-10-04 NOTE — Telephone Encounter (Signed)
Requested Prescriptions  Pending Prescriptions Disp Refills   metoprolol succinate (TOPROL-XL) 100 MG 24 hr tablet [Pharmacy Med Name: METOPROLOL SUCCINATE ER TABS 100MG ] 90 tablet 1    Sig: TAKE 1 TABLET DAILY. TAKE WITH OR IMMEDIATELY FOLLOWING A MEAL     Cardiovascular:  Beta Blockers Failed - 10/04/2019  4:46 PM      Failed - Last BP in normal range    BP Readings from Last 1 Encounters:  09/24/19 (!) 161/83         Passed - Last Heart Rate in normal range    Pulse Readings from Last 1 Encounters:  09/24/19 83         Passed - Valid encounter within last 6 months    Recent Outpatient Visits          3 weeks ago Leg swelling   [facility] [place], [place], MD   1 month ago Essential hypertension   [facility] [place], [place], MD   7 months ago Encounter for annual physical exam   [facility], MD   1 year ago Essential hypertension   [facility], [place], MD   1 year ago Essential hypertension   [facility] [place], [place], MD      Future Appointments            In 4 months Bacigalupo, Marzella Schlein, MD [facility]            losartan-hydrochlorothiazide (HYZAAR) 100-25 MG tablet [Pharmacy Med Name: LOSARTAN/ HYDROCHLOROTHIAZIDE TABS 100/25MG ] 90 tablet 3    Sig: TAKE 1 TABLET DAILY     Cardiovascular: ARB + Diuretic Combos Failed - 10/04/2019  4:46 PM      Failed - K in normal range and within 180 days    Potassium  Date Value Ref Range Status  09/10/2019 3.4 (L) 3.5 - 5.2 mmol/L Final         Failed - Last BP in normal range    BP Readings from Last 1 Encounters:  09/24/19 (!) 161/83         Passed - Na in normal range and within 180 days    Sodium  Date Value Ref Range Status  09/10/2019 134 134 - 144 mmol/L Final         Passed - Cr in normal range and within 180 days    Creat  Date Value Ref Range Status   01/13/2017 0.79 0.50 - 0.99 mg/dL Final    Comment:    For patients >11 years of age, the reference limit for Creatinine is approximately 13% higher for people identified as African-American. .    Creatinine, Ser  Date Value Ref Range Status  09/10/2019 0.83 0.57 - 1.00 mg/dL Final         Passed - Ca in normal range and within 180 days    Calcium  Date Value Ref Range Status  09/10/2019 9.4 8.7 - 10.3 mg/dL Final         Passed - Patient is not pregnant      Passed - Valid encounter within last 6 months    Recent Outpatient Visits          3 weeks ago Leg swelling   [facility], [place], MD   1 month ago Essential hypertension   [facility] [place], [place], MD   7 months ago Encounter for annual physical exam  [facility] Bacigalupo, Dionne Bucy, MD   1 year ago Essential hypertension   [facility] [place], Dionne Bucy, MD   1 year ago Essential hypertension   [place], Dionne Bucy, MD      Future Appointments            In 4 months Bacigalupo, Dionne Bucy, MD [facility] [place]

## 2019-10-04 NOTE — Telephone Encounter (Signed)
Requested medication (s) are due for refill today: yes  Requested medication (s) are on the active medication list:yes  Last refill:  08/10/19  Future visit scheduled: yes  Notes to clinic:  Patient potassium on 08/16/19 4.0   on 09/10/19 3.4.     Requested Prescriptions  Pending Prescriptions Disp Refills   losartan-hydrochlorothiazide (HYZAAR) 100-25 MG tablet [Pharmacy Med Name: LOSARTAN/ HYDROCHLOROTHIAZIDE TABS 100/25MG ] 90 tablet 3    Sig: TAKE 1 TABLET DAILY      Cardiovascular: ARB + Diuretic Combos Failed - 10/04/2019  4:46 PM      Failed - K in normal range and within 180 days    Potassium  Date Value Ref Range Status  09/10/2019 3.4 (L) 3.5 - 5.2 mmol/L Final          Failed - Last BP in normal range    BP Readings from Last 1 Encounters:  09/24/19 (!) 161/83          Passed - Na in normal range and within 180 days    Sodium  Date Value Ref Range Status  09/10/2019 134 134 - 144 mmol/L Final          Passed - Cr in normal range and within 180 days    Creat  Date Value Ref Range Status  01/13/2017 0.79 0.50 - 0.99 mg/dL Final    Comment:    For patients >69 years of age, the reference limit for Creatinine is approximately 13% higher for people identified as African-American. .    Creatinine, Ser  Date Value Ref Range Status  09/10/2019 0.83 0.57 - 1.00 mg/dL Final          Passed - Ca in normal range and within 180 days    Calcium  Date Value Ref Range Status  09/10/2019 9.4 8.7 - 10.3 mg/dL Final          Passed - Patient is not pregnant      Passed - Valid encounter within last 6 months    Recent Outpatient Visits           3 weeks ago Leg swelling   [facility] [place], Dionne Bucy, MD   1 month ago Essential hypertension   [place], Dionne Bucy, MD   7 months ago Encounter for annual physical exam   [facility], Dionne Bucy, MD   1 year ago Essential hypertension    [facility] [place], Dionne Bucy, MD   1 year ago Essential hypertension   [place], Dionne Bucy, MD       Future Appointments             In 4 months Bacigalupo, Dionne Bucy, MD [facility]             Signed Prescriptions Disp Refills   metoprolol succinate (TOPROL-XL) 100 MG 24 hr tablet 90 tablet 1    Sig: TAKE 1 TABLET DAILY. TAKE WITH OR IMMEDIATELY FOLLOWING A MEAL      Cardiovascular:  Beta Blockers Failed - 10/04/2019  4:46 PM      Failed - Last BP in normal range    BP Readings from Last 1 Encounters:  09/24/19 (!) 161/83          Passed - Last Heart Rate in normal range    Pulse Readings from Last 1 Encounters:  09/24/19 83          Passed - Valid encounter  within last 6 months    Recent Outpatient Visits           3 weeks ago Leg swelling   [facility] [place], Marzella Schlein, MD   1 month ago Essential hypertension   [facility] [place], Marzella Schlein, MD   7 months ago Encounter for annual physical exam   [facility], Marzella Schlein, MD   1 year ago Essential hypertension   [facility] [place], Marzella Schlein, MD   1 year ago Essential hypertension   [facility] Bacigalupo, Marzella Schlein, MD       Future Appointments             In 4 months Bacigalupo, Marzella Schlein, MD [facility]

## 2019-10-26 ENCOUNTER — Other Ambulatory Visit
Admission: RE | Admit: 2019-10-26 | Discharge: 2019-10-26 | Disposition: A | Discharge status: Home / Self Care | Payer: Medicare Other | Source: Ambulatory Visit | Attending: Gastroenterology | Admitting: Gastroenterology

## 2019-10-26 ENCOUNTER — Other Ambulatory Visit: Payer: Self-pay

## 2019-10-26 DIAGNOSIS — Z20822 Contact with and (suspected) exposure to covid-19: Secondary | ICD-10-CM | POA: Diagnosis not present

## 2019-10-26 DIAGNOSIS — Z01812 Encounter for preprocedural laboratory examination: Secondary | ICD-10-CM | POA: Diagnosis present

## 2019-10-27 LAB — SARS CORONAVIRUS 2 (TAT 6-24 HRS): SARS Coronavirus 2: NEGATIVE

## 2019-10-30 ENCOUNTER — Encounter: Payer: Self-pay | Admitting: Gastroenterology

## 2019-10-30 ENCOUNTER — Ambulatory Visit
Admission: RE | Admit: 2019-10-30 | Discharge: 2019-10-30 | Disposition: A | Discharge status: Home / Self Care | Payer: Medicare Other | Attending: Gastroenterology | Admitting: Gastroenterology

## 2019-10-30 ENCOUNTER — Ambulatory Visit: Payer: Medicare Other | Admitting: Anesthesiology

## 2019-10-30 ENCOUNTER — Encounter
Admission: RE | Disposition: A | Discharge status: Home / Self Care | Payer: Self-pay | Source: Home / Self Care | Attending: Gastroenterology

## 2019-10-30 DIAGNOSIS — Z9851 Tubal ligation status: Secondary | ICD-10-CM | POA: Diagnosis not present

## 2019-10-30 DIAGNOSIS — Z79899 Other long term (current) drug therapy: Secondary | ICD-10-CM | POA: Insufficient documentation

## 2019-10-30 DIAGNOSIS — Z91041 Radiographic dye allergy status: Secondary | ICD-10-CM | POA: Diagnosis not present

## 2019-10-30 DIAGNOSIS — K219 Gastro-esophageal reflux disease without esophagitis: Secondary | ICD-10-CM | POA: Diagnosis not present

## 2019-10-30 DIAGNOSIS — I10 Essential (primary) hypertension: Secondary | ICD-10-CM | POA: Diagnosis not present

## 2019-10-30 DIAGNOSIS — Z87891 Personal history of nicotine dependence: Secondary | ICD-10-CM | POA: Insufficient documentation

## 2019-10-30 DIAGNOSIS — Z9049 Acquired absence of other specified parts of digestive tract: Secondary | ICD-10-CM | POA: Insufficient documentation

## 2019-10-30 DIAGNOSIS — Z90721 Acquired absence of ovaries, unilateral: Secondary | ICD-10-CM | POA: Diagnosis not present

## 2019-10-30 DIAGNOSIS — I341 Nonrheumatic mitral (valve) prolapse: Secondary | ICD-10-CM | POA: Insufficient documentation

## 2019-10-30 DIAGNOSIS — B9681 Helicobacter pylori [H. pylori] as the cause of diseases classified elsewhere: Secondary | ICD-10-CM | POA: Insufficient documentation

## 2019-10-30 DIAGNOSIS — K2970 Gastritis, unspecified, without bleeding: Secondary | ICD-10-CM

## 2019-10-30 DIAGNOSIS — Z888 Allergy status to other drugs, medicaments and biological substances status: Secondary | ICD-10-CM | POA: Insufficient documentation

## 2019-10-30 DIAGNOSIS — Z8249 Family history of ischemic heart disease and other diseases of the circulatory system: Secondary | ICD-10-CM | POA: Insufficient documentation

## 2019-10-30 DIAGNOSIS — Z7982 Long term (current) use of aspirin: Secondary | ICD-10-CM | POA: Diagnosis not present

## 2019-10-30 DIAGNOSIS — K2270 Barrett's esophagus without dysplasia: Secondary | ICD-10-CM | POA: Insufficient documentation

## 2019-10-30 DIAGNOSIS — K449 Diaphragmatic hernia without obstruction or gangrene: Secondary | ICD-10-CM | POA: Insufficient documentation

## 2019-10-30 DIAGNOSIS — K22719 Barrett's esophagus with dysplasia, unspecified: Secondary | ICD-10-CM

## 2019-10-30 DIAGNOSIS — E785 Hyperlipidemia, unspecified: Secondary | ICD-10-CM | POA: Diagnosis not present

## 2019-10-30 HISTORY — PX: ESOPHAGOGASTRODUODENOSCOPY (EGD) WITH PROPOFOL: SHX5813

## 2019-10-30 SURGERY — ESOPHAGOGASTRODUODENOSCOPY (EGD) WITH PROPOFOL
Anesthesia: General

## 2019-10-30 MED ORDER — SODIUM CHLORIDE 0.9 % IV SOLN: INTRAVENOUS | Status: DC

## 2019-10-30 MED ORDER — LIDOCAINE HCL (CARDIAC) PF 100 MG/5ML IV SOSY
PREFILLED_SYRINGE | INTRAVENOUS | Status: DC | PRN
  Administered 2019-10-30: 20 mg via INTRAVENOUS

## 2019-10-30 MED ORDER — PROPOFOL 10 MG/ML IV BOLUS
INTRAVENOUS | Status: DC | PRN
  Administered 2019-10-30: 50 mg via INTRAVENOUS

## 2019-10-30 MED ORDER — PROPOFOL 500 MG/50ML IV EMUL
INTRAVENOUS | Status: DC | PRN
  Administered 2019-10-30: 50 mg via INTRAVENOUS

## 2019-10-30 MED ORDER — LACTATED RINGERS IV SOLN: INTRAVENOUS | Status: DC | PRN

## 2019-10-30 NOTE — H&P (Signed)
Midge Minium, MD [facility] [address], [address] [place], [place] [address] Phone:[phone] Fax : [phone]  Primary Care Physician:  Erasmo Downer, MD Primary Gastroenterologist:  Dr. Servando Snare  Pre-Procedure History & Physical: HPI:  Kara French is a 69 y.o. female is here for an endoscopy.   Past Medical History:  Diagnosis Date  . Allergic rhinitis   . Barrett's esophagus 2017   biopsy confirmed, related to GERD  . GERD (gastroesophageal reflux disease)   . Hyperlipidemia   . Hypertension   . Mitral valve prolapse    followed by cardiology  . Precancerous skin lesion     Past Surgical History:  Procedure Laterality Date  . BARTHOLIN GLAND CYST EXCISION  1975  . BREAST ENHANCEMENT SURGERY Bilateral 1975  . EYE SURGERY Left 02/28/2019   cataract surgery  . EYE SURGERY Right 03/28/2019   cataract surgery  . LAPAROSCOPIC CHOLECYSTECTOMY  2011  . LAPAROSCOPIC OOPHERECTOMY Right 1987   2/2 benign tumor  . TRANSANAL EXCISION OF RECTAL MASS WITH HEMORRHOID INJECTION  1989   hemorrhoid removal  . TUBAL LIGATION  1987    Prior to Admission medications   Medication Sig Start Date End Date Taking? Authorizing Provider  aspirin 81 MG chewable tablet Chew 1 tablet by mouth daily.   Yes [provider]  Calcium-Vitamin D-Vitamin K (VIACTIV CALCIUM PLUS D) 650-12.5-40 MG-MCG-MCG CHEW Chew 1 each by mouth 2 (two) times daily.   Yes [provider]  cetirizine (ZYRTEC) 10 MG tablet Take 10 mg by mouth daily.   Yes [provider]  losartan-hydrochlorothiazide (HYZAAR) 100-25 MG tablet TAKE 1 TABLET DAILY 10/04/19  Yes Bacigalupo, Marzella Schlein, MD  metoprolol succinate (TOPROL-XL) 100 MG 24 hr tablet TAKE 1 TABLET DAILY. TAKE WITH OR IMMEDIATELY FOLLOWING A MEAL 10/04/19  Yes Bacigalupo, Marzella Schlein, MD  Olopatadine HCl (PATADAY) 0.2 % SOLN Apply 1 drop to eye daily. Both eyes   Yes [provider]  pantoprazole (PROTONIX) 40 MG tablet TAKE 1  TABLET DAILY 03/02/19  Yes Bacigalupo, Marzella Schlein, MD  pravastatin (PRAVACHOL) 20 MG tablet Take 1 tablet (20 mg total) by mouth daily. 03/01/19  Yes Bacigalupo, Marzella Schlein, MD  PREBIOTIC PRODUCT PO Take by mouth daily.    Yes [provider]    Allergies as of 09/24/2019 - Review Complete 09/24/2019  Allergen Reaction Noted  . Aller-chlor  [chlorpheniramine]    . Benzalkonium chloride Other (See Comments) 03/24/2017  . Neomycin-bacitracin zn-polymyx  12/11/2012  . Tape  12/11/2012  . Decongestant [pseudoephedrine hcl er] Itching 01/13/2017  . Iodinated diagnostic agents Rash and Hives 06/13/2016    Family History  Problem Relation Age of Onset  . Dementia Mother 80  . Heart disease Mother 22       s/p 7 stents and CABG x3  . Colon cancer Sister 60  . Heart disease Brother        s./p stent placement  . Heart disease Brother        s/p stent placement  . Congestive Heart Failure Maternal Grandmother 82  . Heart attack Maternal Grandfather 66  . Healthy Paternal Grandmother   . Breast cancer Paternal Aunt 73    Social History   Socioeconomic History  . Marital status: Widowed    Spouse name: Not on file  . Number of children: 1  . Years of education: post-graduate  . Highest education level: Master's degree (e.g., MA, MS, MEng, MEd, MSW, MBA)  Occupational History  .  Occupation: retired    Comment: Human resources officer  Tobacco Use  . Smoking status: Former Smoker    Packs/day: 0.50    Years: 15.00    Pack years: 7.50    Types: Cigarettes    Quit date: 04/18/1997    Years since quitting: 22.5  . Smokeless tobacco: Never Used  Vaping Use  . Vaping Use: Never used  Substance and Sexual Activity  . Alcohol use: No  . Drug use: No  . Sexual activity: Not Currently  Other Topics Concern  . Not on file  Social History Narrative  . Not on file   Social Determinants of Health   Financial Resource Strain: Low Risk   . Difficulty of Paying Living Expenses: Not  hard at all  Food Insecurity: No Food Insecurity  . Worried About Programme researcher, broadcasting/film/video in the Last Year: Never true  . Ran Out of Food in the Last Year: Never true  Transportation Needs: No Transportation Needs  . Lack of Transportation (Medical): No  . Lack of Transportation (Non-Medical): No  Physical Activity: Inactive  . Days of Exercise per Week: 0 days  . Minutes of Exercise per Session: 0 min  Stress: No Stress Concern Present  . Feeling of Stress : Not at all  Social Connections: Socially Isolated  . Frequency of Communication with Friends and Family: More than three times a week  . Frequency of Social Gatherings with Friends and Family: Twice a week  . Attends Religious Services: Never  . Active Member of Clubs or Organizations: No  . Attends Banker Meetings: Never  . Marital Status: Widowed  Intimate Partner Violence: Not At Risk  . Fear of Current or Ex-Partner: No  . Emotionally Abused: No  . Physically Abused: No  . Sexually Abused: No    Review of Systems: See HPI, otherwise negative ROS  Physical Exam: BP (!) 172/71   Pulse 75   Temp (!) 97.3 F (36.3 C) (Temporal)   Resp 20   Ht 5\' 7"  (1.702 m)   Wt 90.7 kg   SpO2 99%   BMI 31.32 kg/m  General:   Alert,  pleasant and cooperative in NAD Head:  Normocephalic and atraumatic. Neck:  Supple; no masses or thyromegaly. Lungs:  Clear throughout to auscultation.    Heart:  Regular rate and rhythm. Abdomen:  Soft, nontender and nondistended. Normal bowel sounds, without guarding, and without rebound.   Neurologic:  Alert and  oriented x4;  grossly normal neurologically.  Impression/Plan: is here for an endoscopy to be performed for barrett's esophagus  Risks, benefits, limitations, and alternatives regarding  endoscopy have been reviewed with the patient.  Questions have been answered.  All parties agreeable.   Micael Hampshire, MD  10/30/2019, 8:14 AM

## 2019-10-30 NOTE — Op Note (Signed)
[facility] Patient Name: Kara French Procedure Date: 10/30/2019 8:20 AM MRN: [id number] Account #: [id number] Date of Birth: 1950/09/05 Admit Type: Outpatient Age: 69 Room: [facility] ENDO ROOM 4 Gender: Female Note Status: Finalized Procedure:             Upper GI endoscopy Indications:           Surveillance procedure for barrett's Providers:             Midge Minium MD, MD Referring MD:          Marzella Schlein. Bacigalupo (Referring MD) Medicines:             Propofol per Anesthesia Complications:         No immediate complications. Procedure:             Pre-Anesthesia Assessment:                        - Prior to the procedure, a History and Physical was                         performed, and patient medications and allergies were                         reviewed. The patient's tolerance of previous                         anesthesia was also reviewed. The risks and benefits                         of the procedure and the sedation options and risks                         were discussed with the patient. All questions were                         answered, and informed consent was obtained. Prior                         Anticoagulants: The patient has taken no previous                         anticoagulant or antiplatelet agents. ASA Grade                         Assessment: II - A patient with mild systemic disease.                         After reviewing the risks and benefits, the patient                         was deemed in satisfactory condition to undergo the                         procedure.                        After obtaining informed consent, the endoscope was  passed under direct vision. Throughout the procedure,                         the patient's blood pressure, pulse, and oxygen                         saturations were monitored continuously. The Endoscope                         was introduced through the  mouth, and advanced to the                         second part of duodenum. The upper GI endoscopy was                         accomplished without difficulty. The patient tolerated                         the procedure well. Findings:      A small hiatal hernia was present.      Localized moderate inflammation characterized by erythema was found in       the gastric antrum. Biopsies were taken with a cold forceps for       histology.      The examined duodenum was normal. Impression:            - Small hiatal hernia.                        - Gastritis. Biopsied.                        - Normal examined duodenum. Recommendation:        - Discharge patient to home.                        - Resume previous diet.                        - Continue present medications.                        - Await pathology results. Procedure Code(s):     --- Professional ---                        [phone], Esophagogastroduodenoscopy, flexible,                         transoral; with biopsy, single or multiple Diagnosis Code(s):     --- Professional ---                        Z13.810, Encounter for screening for upper                         gastrointestinal disorder                        K29.70, Gastritis, unspecified, without bleeding CPT copyright 2019 American Medical Association. All rights reserved. The codes documented in this report are preliminary and upon coder review may  be revised to meet current  compliance requirements. Midge Minium MD, MD 10/30/2019 8:38:48 AM This report has been signed electronically. Number of Addenda: 0 Note Initiated On: 10/30/2019 8:20 AM Estimated Blood Loss:  Estimated blood loss: none.      [facility]

## 2019-10-30 NOTE — Transfer of Care (Signed)
Immediate Anesthesia Transfer of Care Note  Patient: Kara French  Procedure(s) Performed: ESOPHAGOGASTRODUODENOSCOPY (EGD) WITH PROPOFOL (N/A )  Patient Location: PACU  Anesthesia Type:MAC  Level of Consciousness: awake, alert  and oriented  Airway & Oxygen Therapy: Patient Spontanous Breathing and Patient connected to nasal cannula oxygen  Post-op Assessment: Report given to RN and Post -op Vital signs reviewed and stable  Post vital signs: Reviewed and stable  Last Vitals:  Vitals Value Taken Time  BP 130/54 10/30/19 0843  Temp 36.3 C 10/30/19 0843  Pulse 89 10/30/19 0845  Resp 21 10/30/19 0845  SpO2 100 % 10/30/19 0845  Vitals shown include unvalidated device data.  Last Pain:  Vitals:   10/30/19 0843  TempSrc: Temporal  PainSc: 0-No pain         Complications: No complications documented.

## 2019-10-30 NOTE — Anesthesia Preprocedure Evaluation (Signed)
Anesthesia Evaluation  Patient identified by MRN, date of birth, ID band Patient awake    Reviewed: Allergy & Precautions, NPO status , Patient's Chart, lab work & pertinent test results  History of Anesthesia Complications (+) PONV and history of anesthetic complications  Airway Mallampati: II  TM Distance: >3 FB Neck ROM: Limited    Dental no notable dental hx. (+) Teeth Intact   Pulmonary asthma , neg sleep apnea, neg COPD, Patient abstained from smoking.Not current smoker, former smoker,    Pulmonary exam normal breath sounds clear to auscultation       Cardiovascular Exercise Tolerance: Good METShypertension, (-) CAD and (-) Past MI (-) dysrhythmias  Rhythm:Regular Rate:Normal - Systolic murmurs    Neuro/Psych negative neurological ROS  negative psych ROS   GI/Hepatic GERD  Medicated,(+)     (-) substance abuse  ,   Endo/Other  neg diabetes  Renal/GU negative Renal ROS     Musculoskeletal   Abdominal   Peds  Hematology   Anesthesia Other Findings Past Medical History: No date: Allergic rhinitis 2017: Barrett's esophagus     Comment:  biopsy confirmed, related to GERD No date: GERD (gastroesophageal reflux disease) No date: Hyperlipidemia No date: Hypertension No date: Mitral valve prolapse     Comment:  followed by cardiology No date: Precancerous skin lesion  Reproductive/Obstetrics                           Anesthesia Physical Anesthesia Plan  ASA: II  Anesthesia Plan: General   Post-op Pain Management:    Induction: Intravenous  PONV Risk Score and Plan: 4 or greater and Propofol infusion and TIVA  Airway Management Planned: Nasal Cannula and Natural Airway  Additional Equipment: None  Intra-op Plan:   Post-operative Plan:   Informed Consent: I have reviewed the patients History and Physical, chart, labs and discussed the procedure including the risks,  benefits and alternatives for the proposed anesthesia with the patient or authorized representative who has indicated his/her understanding and acceptance.     Dental advisory given  Plan Discussed with: CRNA and Surgeon  Anesthesia Plan Comments: (Discussed risks of anesthesia with patient, including possibility of difficulty with spontaneous ventilation under anesthesia necessitating airway intervention, PONV, and rare risks such as cardiac or respiratory or neurological events. Patient understands.)        Anesthesia Quick Evaluation

## 2019-10-31 ENCOUNTER — Encounter: Payer: Self-pay | Admitting: Gastroenterology

## 2019-11-01 ENCOUNTER — Encounter: Payer: Self-pay | Admitting: Gastroenterology

## 2019-11-01 LAB — SURGICAL PATHOLOGY

## 2019-11-05 ENCOUNTER — Encounter: Payer: Self-pay | Admitting: Gastroenterology

## 2019-11-05 NOTE — Anesthesia Postprocedure Evaluation (Signed)
Anesthesia Post Note  Patient: Dabney Dever Misko  Procedure(s) Performed: ESOPHAGOGASTRODUODENOSCOPY (EGD) WITH PROPOFOL (N/A )  Patient location during evaluation: Endoscopy Anesthesia Type: General Level of consciousness: awake and alert Pain management: pain level controlled Vital Signs Assessment: post-procedure vital signs reviewed and stable Respiratory status: spontaneous breathing, nonlabored ventilation, respiratory function stable and patient connected to nasal cannula oxygen Cardiovascular status: blood pressure returned to baseline and stable Postop Assessment: no apparent nausea or vomiting Anesthetic complications: no   No complications documented.   Last Vitals:  Vitals:   10/30/19 0802 10/30/19 0843  BP: (!) 172/71 (!) 130/54  Pulse: 75   Resp: 20   Temp: (!) 36.3 C (!) 36.3 C  SpO2: 99%     Last Pain:  Vitals:   10/30/19 0913  TempSrc:   PainSc: 0-No pain                 Corinda Gubler

## 2019-11-07 ENCOUNTER — Other Ambulatory Visit: Payer: Self-pay

## 2019-11-07 MED ORDER — FLUCONAZOLE 150 MG PO TABS
ORAL_TABLET | ORAL | 0 refills | Status: DC
Start: 2019-11-07 — End: 2020-02-18

## 2019-11-07 MED ORDER — AMOXICILLIN 500 MG PO CAPS
1000.0000 mg | ORAL_CAPSULE | Freq: Two times a day (BID) | ORAL | 0 refills | Status: DC
Start: 2019-11-07 — End: 2020-02-18

## 2019-11-07 MED ORDER — CLARITHROMYCIN 500 MG PO TABS
500.0000 mg | ORAL_TABLET | Freq: Two times a day (BID) | ORAL | 0 refills | Status: DC
Start: 2019-11-07 — End: 2020-02-18

## 2019-11-19 ENCOUNTER — Telehealth: Payer: Self-pay

## 2019-11-19 NOTE — Telephone Encounter (Signed)
mychart message forwarded to pt

## 2019-12-28 ENCOUNTER — Other Ambulatory Visit: Payer: Self-pay | Admitting: Family Medicine

## 2020-01-07 ENCOUNTER — Other Ambulatory Visit: Payer: Self-pay

## 2020-01-07 DIAGNOSIS — Z8619 Personal history of other infectious and parasitic diseases: Secondary | ICD-10-CM

## 2020-01-08 LAB — H. PYLORI BREATH TEST: H pylori Breath Test: NEGATIVE

## 2020-01-09 ENCOUNTER — Telehealth: Payer: Self-pay

## 2020-01-09 NOTE — Telephone Encounter (Signed)
Pt notified of results via mychart.  

## 2020-01-09 NOTE — Telephone Encounter (Signed)
-----   Message from Midge Minium, MD sent at 01/09/2020  7:28 AM EDT ----- The last message should have read "Let the patient know the H. Pylori breath test was negative"

## 2020-02-18 ENCOUNTER — Ambulatory Visit (INDEPENDENT_AMBULATORY_CARE_PROVIDER_SITE_OTHER): Payer: Medicare Other | Admitting: Family Medicine

## 2020-02-18 ENCOUNTER — Encounter: Payer: Self-pay | Admitting: Family Medicine

## 2020-02-18 ENCOUNTER — Other Ambulatory Visit: Payer: Self-pay

## 2020-02-18 VITALS — BP 147/65 | HR 60 | Temp 98.6°F | Resp 16 | Ht 66.0 in | Wt 208.4 lb

## 2020-02-18 DIAGNOSIS — R739 Hyperglycemia, unspecified: Secondary | ICD-10-CM | POA: Diagnosis not present

## 2020-02-18 DIAGNOSIS — Z0000 Encounter for general adult medical examination without abnormal findings: Secondary | ICD-10-CM

## 2020-02-18 DIAGNOSIS — M810 Age-related osteoporosis without current pathological fracture: Secondary | ICD-10-CM

## 2020-02-18 DIAGNOSIS — I10 Essential (primary) hypertension: Secondary | ICD-10-CM | POA: Diagnosis not present

## 2020-02-18 DIAGNOSIS — E669 Obesity, unspecified: Secondary | ICD-10-CM

## 2020-02-18 DIAGNOSIS — E782 Mixed hyperlipidemia: Secondary | ICD-10-CM | POA: Diagnosis not present

## 2020-02-18 DIAGNOSIS — Z23 Encounter for immunization: Secondary | ICD-10-CM | POA: Diagnosis not present

## 2020-02-18 DIAGNOSIS — Z5320 Procedure and treatment not carried out because of patient's decision for unspecified reasons: Secondary | ICD-10-CM

## 2020-02-18 DIAGNOSIS — M797 Fibromyalgia: Secondary | ICD-10-CM

## 2020-02-18 MED ORDER — DULOXETINE HCL 20 MG PO CPEP
20.0000 mg | ORAL_CAPSULE | Freq: Every day | ORAL | 3 refills | Status: DC
Start: 2020-02-18 — End: 2020-03-14

## 2020-02-18 NOTE — Assessment & Plan Note (Signed)
Longstanding Uncontrolled Contributing to regular tension headaches and myalgias Previously well controlled with amitriptyline, but this was stopped by previous MD at 63 Did not tolerate nortriptyline Trial of Cymbalta 20 mg daily

## 2020-02-18 NOTE — Assessment & Plan Note (Signed)
Discussed importance of healthy weight management Discussed diet and exercise  

## 2020-02-18 NOTE — Assessment & Plan Note (Signed)
Previously well controlled Elevated today Well-controlled at home She does have some whitecoat hypertension No changes to medications Recheck metabolic panel Follow-up in 6 months

## 2020-02-18 NOTE — Patient Instructions (Signed)
Preventive Care 38 Years and Older, Female Preventive care refers to lifestyle choices and visits with your health care provider that can promote health and wellness. This includes:  A yearly physical exam. This is also called an annual well check.  Regular dental and eye exams.  Immunizations.  Screening for certain conditions.  Healthy lifestyle choices, such as diet and exercise. What can I expect for my preventive care visit? Physical exam Your health care provider will check:  Height and weight. These may be used to calculate body mass index (BMI), which is a measurement that tells if you are at a healthy weight.  Heart rate and blood pressure.  Your skin for abnormal spots. Counseling Your health care provider may ask you questions about:  Alcohol, tobacco, and drug use.  Emotional well-being.  Home and relationship well-being.  Sexual activity.  Eating habits.  History of falls.  Memory and ability to understand (cognition).  Work and work Statistician.  Pregnancy and menstrual history. What immunizations do I need?  Influenza (flu) vaccine  This is recommended every year. Tetanus, diphtheria, and pertussis (Tdap) vaccine  You may need a Td booster every 10 years. Varicella (chickenpox) vaccine  You may need this vaccine if you have not already been vaccinated. Zoster (shingles) vaccine  You may need this after age 33. Pneumococcal conjugate (PCV13) vaccine  One dose is recommended after age 33. Pneumococcal polysaccharide (PPSV23) vaccine  One dose is recommended after age 72. Measles, mumps, and rubella (MMR) vaccine  You may need at least one dose of MMR if you were born in 1957 or later. You may also need a second dose. Meningococcal conjugate (MenACWY) vaccine  You may need this if you have certain conditions. Hepatitis A vaccine  You may need this if you have certain conditions or if you travel or work in places where you may be exposed  to hepatitis A. Hepatitis B vaccine  You may need this if you have certain conditions or if you travel or work in places where you may be exposed to hepatitis B. Haemophilus influenzae type b (Hib) vaccine  You may need this if you have certain conditions. You may receive vaccines as individual doses or as more than one vaccine together in one shot (combination vaccines). Talk with your health care provider about the risks and benefits of combination vaccines. What tests do I need? Blood tests  Lipid and cholesterol levels. These may be checked every 5 years, or more frequently depending on your overall health.  Hepatitis C test.  Hepatitis B test. Screening  Lung cancer screening. You may have this screening every year starting at age 39 if you have a 30-pack-year history of smoking and currently smoke or have quit within the past 15 years.  Colorectal cancer screening. All adults should have this screening starting at age 36 and continuing until age 15. Your health care provider may recommend screening at age 23 if you are at increased risk. You will have tests every 1-10 years, depending on your results and the type of screening test.  Diabetes screening. This is done by checking your blood sugar (glucose) after you have not eaten for a while (fasting). You may have this done every 1-3 years.  Mammogram. This may be done every 1-2 years. Talk with your health care provider about how often you should have regular mammograms.  BRCA-related cancer screening. This may be done if you have a family history of breast, ovarian, tubal, or peritoneal cancers.  Other tests  Sexually transmitted disease (STD) testing.  Bone density scan. This is done to screen for osteoporosis. You may have this done starting at age 44. Follow these instructions at home: Eating and drinking  Eat a diet that includes fresh fruits and vegetables, whole grains, lean protein, and low-fat dairy products. Limit  your intake of foods with high amounts of sugar, saturated fats, and salt.  Take vitamin and mineral supplements as recommended by your health care provider.  Do not drink alcohol if your health care provider tells you not to drink.  If you drink alcohol: ? Limit how much you have to 0-1 drink a day. ? Be aware of how much alcohol is in your drink. In the [place] one drink equals one 12 oz bottle of beer (355 mL), one 5 oz glass of wine (148 mL), or one 1 oz glass of hard liquor (44 mL). Lifestyle  Take daily care of your teeth and gums.  Stay active. Exercise for at least 30 minutes on 5 or more days each week.  Do not use any products that contain nicotine or tobacco, such as cigarettes, e-cigarettes, and chewing tobacco. If you need help quitting, ask your health care provider.  If you are sexually active, practice safe sex. Use a condom or other form of protection in order to prevent STIs (sexually transmitted infections).  Talk with your health care provider about taking a low-dose aspirin or statin. What's next?  Go to your health care provider once a year for a well check visit.  Ask your health care provider how often you should have your eyes and teeth checked.  Stay up to date on all vaccines. This information is not intended to replace advice given to you by your health care provider. Make sure you discuss any questions you have with your health care provider. Document Revised: 03/30/2018 Document Reviewed: 03/30/2018 Elsevier Patient Education  2020 [employer].

## 2020-02-18 NOTE — Assessment & Plan Note (Signed)
Previously well controlled Continue statin Repeat FLP and CMP  

## 2020-02-18 NOTE — Assessment & Plan Note (Signed)
Osteoporosis on last DEXA  Recommend regular weight bearing exercise, avoiding smoking, and adequate Ca (1200mg /day) and Vit D (1000 units daily) via diet or supplement. Not a good candidate for bisphosphonate due to significant GERD/Barrett's esophagus Referral to endocrinology for consideration of Prolia

## 2020-02-18 NOTE — Progress Notes (Signed)
Complete physical exam   Patient: Kara French   DOB: 1950-08-25   69 y.o. Female  MRN: [id number] Visit Date: 02/18/2020  Today's healthcare provider: Shirlee Latch, MD   Chief Complaint  Patient presents with   Annual Exam   Subjective    Kara French is a 69 y.o. female who presents today for a complete physical exam.  She reports consuming a general diet. Home exercise routine includes walking 1 hrs per day. She generally feels well. She reports sleeping well. She does not have additional problems to discuss today.  HPI    Past Medical History:  Diagnosis Date   Allergic rhinitis    Barrett's esophagus 2017   biopsy confirmed, related to GERD   GERD (gastroesophageal reflux disease)    Hyperlipidemia    Hypertension    Mitral valve prolapse    followed by cardiology   Precancerous skin lesion    Past Surgical History:  Procedure Laterality Date   BARTHOLIN GLAND CYST EXCISION  1975   BREAST ENHANCEMENT SURGERY Bilateral 1975   ESOPHAGOGASTRODUODENOSCOPY (EGD) WITH PROPOFOL N/A 10/30/2019   Procedure: ESOPHAGOGASTRODUODENOSCOPY (EGD) WITH PROPOFOL;  Surgeon: Midge Minium, MD;  Location: [facility] ENDOSCOPY;  Service: Endoscopy;  Laterality: N/A;   EYE SURGERY Left 02/28/2019   cataract surgery   EYE SURGERY Right 03/28/2019   cataract surgery   LAPAROSCOPIC CHOLECYSTECTOMY  2011   LAPAROSCOPIC OOPHERECTOMY Right 1987   2/2 benign tumor   TRANSANAL EXCISION OF RECTAL MASS WITH HEMORRHOID INJECTION  1989   hemorrhoid removal   TUBAL LIGATION  1987   Social History   Socioeconomic History   Marital status: Widowed    Spouse name: Not on file   Number of children: 1   Years of education: post-graduate   Highest education level: Master's degree (e.g., MA, MS, MEng, MEd, MSW, MBA)  Occupational History   Occupation: retired    Comment: Human resources officer  Tobacco Use   Smoking status: Former Smoker    Packs/day: 0.50     Years: 15.00    Pack years: 7.50    Types: Cigarettes    Quit date: 04/18/1997    Years since quitting: 22.8   Smokeless tobacco: Never Used  Vaping Use   Vaping Use: Never used  Substance and Sexual Activity   Alcohol use: No   Drug use: No   Sexual activity: Not Currently  Other Topics Concern   Not on file  Social History Narrative   Not on file   Social Determinants of Health   Financial Resource Strain: Low Risk    Difficulty of Paying Living Expenses: Not hard at all  Food Insecurity: No Food Insecurity   Worried About Programme researcher, broadcasting/film/video in the Last Year: Never true   Ran Out of Food in the Last Year: Never true  Transportation Needs: No Transportation Needs   Lack of Transportation (Medical): No   Lack of Transportation (Non-Medical): No  Physical Activity: Inactive   Days of Exercise per Week: 0 days   Minutes of Exercise per Session: 0 min  Stress: No Stress Concern Present   Feeling of Stress : Not at all  Social Connections: Socially Isolated   Frequency of Communication with Friends and Family: More than three times a week   Frequency of Social Gatherings with Friends and Family: Twice a week   Attends Religious Services: Never   Database administrator or Organizations: No   Attends Banker  Meetings: Never   Marital Status: Widowed  Catering manager Violence: Not At Risk   Fear of Current or Ex-Partner: No   Emotionally Abused: No   Physically Abused: No   Sexually Abused: No   Family Status  Relation Name Status   Mother  Deceased   Father  Deceased       accidental; mining accident   Sister  Deceased   Brother  Alive   Brother  Alive   MGM  Deceased   MGF  Deceased   PGM  Deceased at age 29       old age   Dennie Bible Aunt  Deceased   Family History  Problem Relation Age of Onset   Dementia Mother 62   Heart disease Mother 59       s/p 7 stents and CABG x3   Colon cancer Sister 61   Heart  disease Brother        s./p stent placement   Heart disease Brother        s/p stent placement   Congestive Heart Failure Maternal Grandmother 82   Heart attack Maternal Grandfather 2   Healthy Paternal Grandmother    Breast cancer Paternal Aunt 83   Allergies  Allergen Reactions   Aller-Chlor  [Chlorpheniramine]    Benzalkonium Chloride Other (See Comments)    BLISTERS   Neomycin-Bacitracin Zn-Polymyx    Tape    Decongestant [Pseudoephedrine Hcl Er] Itching   Iodinated Diagnostic Agents Rash and Hives    Patient Care Team: Erasmo Downer, MD as PCP - General (Family Medicine) [facility], [facility] (Dermatology)   Medications: Outpatient Medications Prior to Visit  Medication Sig   aspirin 81 MG chewable tablet Chew 1 tablet by mouth daily.   Calcium-Vitamin D-Vitamin K (VIACTIV CALCIUM PLUS D) 650-12.5-40 MG-MCG-MCG CHEW Chew 1 each by mouth 2 (two) times daily.   cetirizine (ZYRTEC) 10 MG tablet Take 10 mg by mouth daily.   losartan-hydrochlorothiazide (HYZAAR) 100-25 MG tablet TAKE 1 TABLET DAILY   metoprolol succinate (TOPROL-XL) 100 MG 24 hr tablet TAKE 1 TABLET DAILY. TAKE WITH OR IMMEDIATELY FOLLOWING A MEAL   Olopatadine HCl (PATADAY) 0.2 % SOLN Apply 1 drop to eye daily. Both eyes   pantoprazole (PROTONIX) 40 MG tablet TAKE 1 TABLET DAILY   pravastatin (PRAVACHOL) 20 MG tablet TAKE 1 TABLET DAILY   PREBIOTIC PRODUCT PO Take by mouth daily.    [DISCONTINUED] fluconazole (DIFLUCAN) 150 MG tablet Take 1 tablet now and one 3 days later   [DISCONTINUED] amoxicillin (AMOXIL) 500 MG capsule Take 2 capsules (1,000 mg total) by mouth 2 (two) times daily. (Patient not taking: Reported on 02/18/2020)   [DISCONTINUED] clarithromycin (BIAXIN) 500 MG tablet Take 1 tablet (500 mg total) by mouth 2 (two) times daily. (Patient not taking: Reported on 02/18/2020)   No facility-administered medications prior to visit.    Review of Systems    Constitutional: Negative.   HENT: Negative.   Eyes: Negative.   Respiratory: Negative.   Cardiovascular: Negative.   Gastrointestinal: Negative.   Endocrine: Negative.   Genitourinary: Negative.   Musculoskeletal: Negative.   Skin: Negative.   Allergic/Immunologic: Negative.   Neurological: Negative.   Hematological: Negative.   Psychiatric/Behavioral: Negative.       Objective    BP (!) 147/65 (BP Location: Left Arm, Patient Position: Sitting, Cuff Size: Normal)    Pulse 60    Temp 98.6 F (37 C) (Oral)    Resp 16    Ht  5\' 6"  (1.676 m)    Wt 208 lb 6.4 oz (94.5 kg)    SpO2 100%    BMI 33.64 kg/m    Physical Exam Vitals reviewed.  Constitutional:      General: She is not in acute distress.    Appearance: Normal appearance. She is well-developed. She is not diaphoretic.  HENT:     Head: Normocephalic and atraumatic.  Eyes:     General: No scleral icterus.    Conjunctiva/sclera: Conjunctivae normal.  Neck:     Thyroid: No thyromegaly.  Cardiovascular:     Rate and Rhythm: Normal rate and regular rhythm.     Pulses: Normal pulses.     Heart sounds: Normal heart sounds. No murmur heard.   Pulmonary:     Effort: Pulmonary effort is normal. No respiratory distress.     Breath sounds: Normal breath sounds. No wheezing, rhonchi or rales.  Chest:     Comments: Breasts: breasts appear normal,  bilateral implants, no palpable abnormalities otherwise.  Musculoskeletal:     Cervical back: Neck supple.     Right lower leg: No edema.     Left lower leg: No edema.  Lymphadenopathy:     Cervical: No cervical adenopathy.  Skin:    General: Skin is warm and dry.     Findings: No rash.  Neurological:     Mental Status: She is alert and oriented to person, place, and time. Mental status is at baseline.  Psychiatric:        Mood and Affect: Mood normal.        Behavior: Behavior normal.       Last depression screening scores PHQ 2/9 Scores 02/18/2020 08/09/2019 02/15/2019   PHQ - 2 Score 0 0 0  PHQ- 9 Score 3 - 0   Last fall risk screening Fall Risk  02/18/2020  Falls in the past year? -  Number falls in past yr: 0  Injury with Fall? 0  Risk for fall due to : No Fall Risks  Follow up Falls evaluation completed   Last Audit-C alcohol use screening Alcohol Use Disorder Test (AUDIT) 02/18/2020  1. How often do you have a drink containing alcohol? 0  2. How many drinks containing alcohol do you have on a typical day when you are drinking? 0  3. How often do you have six or more drinks on one occasion? 0  AUDIT-C Score 0  Alcohol Brief Interventions/Follow-up AUDIT Score <7 follow-up not indicated   A score of 3 or more in women, and 4 or more in men indicates increased risk for alcohol abuse, EXCEPT if all of the points are from question 1   No results found for any visits on 02/18/20.  Assessment & Plan    Routine Health Maintenance and Physical Exam  Exercise Activities and Dietary recommendations Goals     Exercise 3x per week (30 min per time)     [facility] and exercise 5 days a week for 30 minutes or more.        Immunization History  Administered Date(s) Administered   Fluad Quad(high Dose 65+) 02/15/2019, 02/18/2020   Influenza, High Dose Seasonal PF 01/13/2017, 02/14/2018   PFIZER SARS-COV-2 Vaccination 05/29/2019, 06/19/2019, 01/25/2020   Pneumococcal Conjugate-13 06/07/2014   Pneumococcal Polysaccharide-23 06/23/2015   Tdap 03/17/2016    Health Maintenance  Topic Date Due   MAMMOGRAM  08/08/2020 (Originally 10/30/2016)   COLONOSCOPY  09/04/2020  DEXA SCAN  06/24/2021   TETANUS/TDAP  03/17/2026   INFLUENZA VACCINE  Completed   COVID-19 Vaccine  Completed   Hepatitis C Screening  Completed   PNA vac Low Risk Adult  Completed    Discussed health benefits of physical activity, and encouraged her to engage in regular exercise appropriate for her age and condition.  Problem  List Items Addressed This Visit      Cardiovascular and Mediastinum   Hypertension    Previously well controlled Elevated today Well-controlled at home She does have some whitecoat hypertension No changes to medications Recheck metabolic panel Follow-up in 6 months      Relevant Orders   Comprehensive metabolic panel     Musculoskeletal and Integument   Osteoporosis    Osteoporosis on last DEXA  Recommend regular weight bearing exercise, avoiding smoking, and adequate Ca (1200mg /day) and Vit D (1000 units daily) via diet or supplement. Not a good candidate for bisphosphonate due to significant GERD/Barrett's esophagus Referral to endocrinology for consideration of Prolia      Relevant Orders   Ambulatory referral to Endocrinology     Other   Hyperlipidemia    Previously well controlled Continue statin Repeat FLP and CMP       Relevant Orders   Comprehensive metabolic panel   Lipid panel   Obesity (BMI 30-39.9)    Discussed importance of healthy weight management Discussed diet and exercise       Fibromyalgia    Longstanding Uncontrolled Contributing to regular tension headaches and myalgias Previously well controlled with amitriptyline, but this was stopped by previous MD at 63 Did not tolerate nortriptyline Trial of Cymbalta 20 mg daily      Relevant Medications   DULoxetine (CYMBALTA) 20 MG capsule    Other Visit Diagnoses    Encounter for annual physical exam    -  Primary   Relevant Orders   Comprehensive metabolic panel   Lipid panel   Hemoglobin A1c   Hyperglycemia       Relevant Orders   Hemoglobin A1c   Mammogram declined       Need for influenza vaccination       Relevant Orders   Flu Vaccine QUAD High Dose(Fluad) (Completed)       Return in about 6 months (around 08/17/2020) for chronic disease f/u.  And AWV    I, 10/17/2020, MD, have reviewed all documentation for this visit. The documentation on 02/18/20 for the exam,  diagnosis, procedures, and orders are all accurate and complete.   Keithen Capo, 13/01/21, MD, MPH [facility]

## 2020-02-21 LAB — HEMOGLOBIN A1C
Est. average glucose Bld gHb Est-mCnc: 114 mg/dL
Hgb A1c MFr Bld: 5.6 % (ref 4.8–5.6)

## 2020-02-21 LAB — LIPID PANEL
Chol/HDL Ratio: 3.8 ratio (ref 0.0–4.4)
Cholesterol, Total: 154 mg/dL (ref 100–199)
HDL: 41 mg/dL (ref >39)
LDL Chol Calc (NIH): 96 mg/dL (ref 0–99)
Triglycerides: 88 mg/dL (ref 0–149)
VLDL Cholesterol Cal: 17 mg/dL (ref 5–40)

## 2020-02-21 LAB — COMPREHENSIVE METABOLIC PANEL
ALT: 21 IU/L (ref 0–32)
AST: 17 IU/L (ref 0–40)
Albumin/Globulin Ratio: 1.9 (ref 1.2–2.2)
Albumin: 4.1 g/dL (ref 3.7–4.7)
Alkaline Phosphatase: 64 IU/L (ref 44–121)
BUN/Creatinine Ratio: 16 (ref 12–28)
BUN: 14 mg/dL (ref 8–27)
Bilirubin Total: 0.5 mg/dL (ref 0.0–1.2)
CO2: 24 mmol/L (ref 20–29)
Calcium: 9.1 mg/dL (ref 8.7–10.3)
Chloride: 102 mmol/L (ref 96–106)
Creatinine, Ser: 0.88 mg/dL (ref 0.57–1.00)
GFR calc Af Amer: 76 mL/min/{1.73_m2} (ref >59)
GFR calc non Af Amer: 66 mL/min/{1.73_m2} (ref >59)
Globulin, Total: 2.2 g/dL (ref 1.5–4.5)
Glucose: 117 mg/dL — ABNORMAL HIGH (ref 65–99)
Potassium: 3.9 mmol/L (ref 3.5–5.2)
Sodium: 140 mmol/L (ref 134–144)
Total Protein: 6.3 g/dL (ref 6.0–8.5)

## 2020-03-13 ENCOUNTER — Other Ambulatory Visit: Payer: Self-pay | Admitting: Family Medicine

## 2020-06-09 ENCOUNTER — Other Ambulatory Visit: Payer: Self-pay | Admitting: Family Medicine

## 2020-06-09 DIAGNOSIS — K219 Gastro-esophageal reflux disease without esophagitis: Secondary | ICD-10-CM

## 2020-06-09 NOTE — Telephone Encounter (Signed)
Requested Prescriptions  Pending Prescriptions Disp Refills  . metoprolol succinate (TOPROL-XL) 100 MG 24 hr tablet [Pharmacy Med Name: METOPROLOL SUCCINATE ER TABS 100MG ] 90 tablet 3    Sig: TAKE 1 TABLET DAILY. TAKE WITH OR IMMEDIATELY FOLLOWING A MEAL     Cardiovascular:  Beta Blockers Failed - 06/09/2020 12:40 PM      Failed - Last BP in normal range    BP Readings from Last 1 Encounters:  02/18/20 (!) 147/65         Passed - Last Heart Rate in normal range    Pulse Readings from Last 1 Encounters:  02/18/20 60         Passed - Valid encounter within last 6 months    Recent Outpatient Visits          3 months ago Encounter for annual physical exam   13/01/21, [facility], MD   9 months ago Leg swelling   [facility] [place], [place], MD   9 months ago Essential hypertension   [facility], [place], MD   1 year ago Encounter for annual physical exam   [facility] [place], [place], MD   1 year ago Essential hypertension   [facility] Bacigalupo, [facility], MD      Future Appointments            In 2 months Bacigalupo, Marzella Schlein, MD [facility]           . pantoprazole (PROTONIX) 40 MG tablet [Pharmacy Med Name: PANTOPRAZOLE SODIUM DR TABS 40MG ] 90 tablet 3    Sig: TAKE 1 TABLET DAILY     Gastroenterology: Proton Pump Inhibitors Passed - 06/09/2020 12:40 PM      Passed - Valid encounter within last 12 months    Recent Outpatient Visits          3 months ago Encounter for annual physical exam   [facility] [place], [facility], MD   9 months ago Leg swelling   [facility] [place], [facility], MD   9 months ago Essential hypertension   [facility] [place], [facility], MD   1 year ago Encounter for annual physical exam   [facility] [place], [facility], MD   1 year ago Essential  hypertension   [facility] Bacigalupo, Marzella Schlein, MD      Future Appointments            In 2 months Bacigalupo, [facility], MD [facility]

## 2020-06-09 NOTE — Telephone Encounter (Signed)
Requested Prescriptions  Pending Prescriptions Disp Refills  . metoprolol succinate (TOPROL-XL) 100 MG 24 hr tablet [Pharmacy Med Name: METOPROLOL SUCCINATE ER TABS 100MG] 90 tablet 3    Sig: TAKE 1 TABLET DAILY. TAKE WITH OR IMMEDIATELY FOLLOWING A MEAL     Cardiovascular:  Beta Blockers Failed - 06/09/2020 12:40 PM      Failed - Last BP in normal range    BP Readings from Last 1 Encounters:  02/18/20 (!) 147/65         Passed - Last Heart Rate in normal range    Pulse Readings from Last 1 Encounters:  02/18/20 60         Passed - Valid encounter within last 6 months    Recent Outpatient Visits          3 months ago Encounter for annual physical exam   [facility] Bacigalupo, Angela M, MD   9 months ago Leg swelling   [facility] Bacigalupo, Angela M, MD   9 months ago Essential hypertension   [facility] Bacigalupo, Angela M, MD   1 year ago Encounter for annual physical exam   [facility] Bacigalupo, Angela M, MD   1 year ago Essential hypertension   [facility] Bacigalupo, Angela M, MD      Future Appointments            In 2 months Bacigalupo, Angela M, MD [facility]           . pantoprazole (PROTONIX) 40 MG tablet [Pharmacy Med Name: PANTOPRAZOLE SODIUM DR TABS 40MG] 90 tablet 3    Sig: TAKE 1 TABLET DAILY     Gastroenterology: Proton Pump Inhibitors Passed - 06/09/2020 12:40 PM      Passed - Valid encounter within last 12 months    Recent Outpatient Visits          3 months ago Encounter for annual physical exam   [facility] Bacigalupo, Angela M, MD   9 months ago Leg swelling   [facility] Bacigalupo, Angela M, MD   9 months ago Essential hypertension   [facility] Bacigalupo, Angela M, MD   1 year ago Encounter for annual physical exam   [facility] Bacigalupo, Angela M, MD   1 year ago Essential  hypertension   [facility] Bacigalupo, Angela M, MD      Future Appointments            In 2 months Bacigalupo, Angela M, MD [facility]            

## 2020-08-21 ENCOUNTER — Other Ambulatory Visit: Payer: Self-pay | Admitting: Family Medicine

## 2020-08-26 ENCOUNTER — Encounter: Payer: Self-pay | Admitting: Family Medicine

## 2020-08-26 ENCOUNTER — Other Ambulatory Visit: Payer: Self-pay

## 2020-08-26 ENCOUNTER — Ambulatory Visit (INDEPENDENT_AMBULATORY_CARE_PROVIDER_SITE_OTHER): Payer: Medicare Other | Admitting: Family Medicine

## 2020-08-26 VITALS — BP 137/73 | HR 60 | Temp 98.4°F | Wt 207.0 lb

## 2020-08-26 DIAGNOSIS — M810 Age-related osteoporosis without current pathological fracture: Secondary | ICD-10-CM

## 2020-08-26 DIAGNOSIS — I10 Essential (primary) hypertension: Secondary | ICD-10-CM

## 2020-08-26 DIAGNOSIS — E782 Mixed hyperlipidemia: Secondary | ICD-10-CM | POA: Diagnosis not present

## 2020-08-26 DIAGNOSIS — E669 Obesity, unspecified: Secondary | ICD-10-CM | POA: Diagnosis not present

## 2020-08-26 MED ORDER — DULOXETINE HCL 20 MG PO CPEP: 20.0000 mg | ORAL_CAPSULE | Freq: Every day | ORAL | 1 refills | Status: DC

## 2020-08-26 NOTE — Assessment & Plan Note (Signed)
Had reclast with side effects Will not do another injection Will consider Prolia with Endocrinology

## 2020-08-26 NOTE — Progress Notes (Signed)
Established patient visit   Patient: Kara French   DOB: 20-Aug-1950   70 y.o. Female  MRN: [id number] Visit Date: 08/26/2020  Today's healthcare provider: Shirlee Latch, MD   Chief Complaint  Patient presents with  . Hypertension  . Hyperlipidemia   Subjective    Hypertension Pertinent negatives include no chest pain, headaches, neck pain, palpitations or shortness of breath.  Hyperlipidemia Pertinent negatives include no chest pain or shortness of breath.  She is doing well today.   Medications She is taking 20mg  cymbalta has improved her her pain symptoms. She is taking Protonix for reflux and tolerating this medication well. She had reclased infusion and she says she experienced side affects that mimicked flu-like symptoms a few hours later.  She says she does not want to go through that treatment for her osteoporosis and is requesting for another method. Her reports her vitamin B has been low and she is currently taking supplements.   Hypertension  Analeya reports her blood pressure is consistently in the 120s/75.    Hypertension, follow-up  BP Readings from Last 3 Encounters:  08/26/20 137/73  02/18/20 (!) 147/65  10/30/19 (!) 130/54   Wt Readings from Last 3 Encounters:  08/26/20 207 lb (93.9 kg)  02/18/20 208 lb 6.4 oz (94.5 kg)  10/30/19 200 lb (90.7 kg)     She was last seen for hypertension 6 months ago.  Management since that visit includes no changes.  She reports excellent compliance with treatment. She is not having side effects.  She is following a Regular diet. She is exercising. She does not smoke.  Use of agents associated with hypertension: none.   Outside blood pressures are normal at home. Symptoms: No chest pain No chest pressure  No palpitations No syncope  No dyspnea No orthopnea  No paroxysmal nocturnal dyspnea No lower extremity edema   Pertinent labs: Lab Results  Component Value Date   CHOL 154 02/20/2020   HDL 41  02/20/2020   LDLCALC 96 02/20/2020   TRIG 88 02/20/2020   CHOLHDL 3.8 02/20/2020   Lab Results  Component Value Date   NA 140 02/20/2020   K 3.9 02/20/2020   CREATININE 0.88 02/20/2020   GFRNONAA 66 02/20/2020   GFRAA 76 02/20/2020   GLUCOSE 117 (H) 02/20/2020     The 10-year ASCVD risk score 13/06/2019 DC Jr., et al., 2013) is: 15.5%   --------------------------------------------------------------------------------------------------- Lipid/Cholesterol, Follow-up  Last lipid panel Other pertinent labs  Lab Results  Component Value Date   CHOL 154 02/20/2020   HDL 41 02/20/2020   LDLCALC 96 02/20/2020   TRIG 88 02/20/2020   CHOLHDL 3.8 02/20/2020   Lab Results  Component Value Date   ALT 21 02/20/2020   AST 17 02/20/2020   PLT 282 09/10/2019   TSH 0.95 07/21/2016     She was last seen for this 6 months ago.  Management since that visit includes no changes.  She reports excellent compliance with treatment. She is not having side effects.   Symptoms: No chest pain No chest pressure/discomfort  No dyspnea No lower extremity edema  No numbness or tingling of extremity No orthopnea  No palpitations No paroxysmal nocturnal dyspnea  No speech difficulty No syncope    The 10-year ASCVD risk score 09/20/2016 DC Jr., et al., 2013) is: 15.5%  ---------------------------------------------------------------------------------------------------   Patient Active Problem List   Diagnosis Date Noted  . Fibromyalgia 02/18/2020  . Obesity (BMI 30-39.9) 08/16/2019  . Extrinsic  asthma 02/15/2019  . Osteoporosis 02/15/2019  . IBS (irritable bowel syndrome) 05/11/2017  . Hypertension   . Hyperlipidemia   . Allergic rhinitis   . GERD (gastroesophageal reflux disease)   . Mitral valve prolapse    Past Medical History:  Diagnosis Date  . Allergic rhinitis   . Barrett's esophagus 2017   biopsy confirmed, related to GERD  . GERD (gastroesophageal reflux disease)   . Hyperlipidemia    . Hypertension   . Mitral valve prolapse    followed by cardiology  . Precancerous skin lesion    Social History   Tobacco Use  . Smoking status: Former Smoker    Packs/day: 0.50    Years: 15.00    Pack years: 7.50    Types: Cigarettes    Quit date: 04/18/1997    Years since quitting: 23.3  . Smokeless tobacco: Never Used  Vaping Use  . Vaping Use: Never used  Substance Use Topics  . Alcohol use: No  . Drug use: No   Allergies  Allergen Reactions  . Aller-Chlor  [Chlorpheniramine]   . Benzalkonium Chloride Other (See Comments)    BLISTERS  . Neomycin-Bacitracin Zn-Polymyx   . Tape   . Decongestant [Pseudoephedrine Hcl Er] Itching  . Iodinated Diagnostic Agents Rash and Hives     Medications: Outpatient Medications Prior to Visit  Medication Sig  . ASPIRIN 81 PO Take by mouth.  . cetirizine (ZYRTEC) 10 MG tablet Take 10 mg by mouth daily.  . DULoxetine (CYMBALTA) 20 MG capsule TAKE 1 CAPSULE BY MOUTH EVERY DAY  . losartan-hydrochlorothiazide (HYZAAR) 100-25 MG tablet TAKE 1 TABLET DAILY  . metoprolol succinate (TOPROL-XL) 100 MG 24 hr tablet TAKE 1 TABLET DAILY. TAKE WITH OR IMMEDIATELY FOLLOWING A MEAL  . Olopatadine HCl 0.2 % SOLN Apply 1 drop to eye daily. Both eyes  . pantoprazole (PROTONIX) 40 MG tablet TAKE 1 TABLET DAILY  . pravastatin (PRAVACHOL) 20 MG tablet TAKE 1 TABLET DAILY  . PREBIOTIC PRODUCT PO Take by mouth daily.   . [DISCONTINUED] Calcium-Vitamin D-Vitamin K (VIACTIV CALCIUM PLUS D) 650-12.5-40 MG-MCG-MCG CHEW Chew 1 each by mouth 2 (two) times daily.  . [DISCONTINUED] aspirin 81 MG chewable tablet Chew 1 tablet by mouth daily.   No facility-administered medications prior to visit.    Review of Systems  Constitutional: Negative.  Negative for chills and fatigue.  HENT: Negative for ear pain, nosebleeds, sinus pressure, sinus pain and sore throat.   Eyes: Negative for pain.  Respiratory: Negative.  Negative for cough, chest tightness,  shortness of breath and wheezing.   Cardiovascular: Negative.  Negative for chest pain, palpitations and leg swelling.  Gastrointestinal: Negative.  Negative for abdominal pain, blood in stool, diarrhea, nausea and vomiting.  Genitourinary: Negative for dysuria, flank pain, frequency, pelvic pain, urgency and vaginal pain.  Musculoskeletal: Negative for back pain, neck pain and neck stiffness.  Neurological: Negative for dizziness, syncope, weakness, light-headedness, numbness and headaches.        Objective    BP 137/73 (BP Location: Right Arm, Patient Position: Sitting, Cuff Size: Large)   Pulse 60   Temp 98.4 F (36.9 C) (Oral)   Wt 207 lb (93.9 kg)   BMI 33.41 kg/m    Physical Exam Vitals reviewed.  Constitutional:      General: She is not in acute distress.    Appearance: Normal appearance. She is well-developed. She is not diaphoretic.  HENT:     Head: Normocephalic and atraumatic.  Eyes:  General: No scleral icterus.    Conjunctiva/sclera: Conjunctivae normal.  Neck:     Thyroid: No thyromegaly.  Cardiovascular:     Rate and Rhythm: Normal rate and regular rhythm.     Pulses: Normal pulses.     Heart sounds: Normal heart sounds. No murmur heard.   Pulmonary:     Effort: Pulmonary effort is normal. No respiratory distress.     Breath sounds: Normal breath sounds. No wheezing, rhonchi or rales.  Musculoskeletal:     Cervical back: Neck supple.     Right lower leg: No edema.     Left lower leg: No edema.  Lymphadenopathy:     Cervical: No cervical adenopathy.  Skin:    General: Skin is warm and dry.     Findings: No rash.  Neurological:     Mental Status: She is alert and oriented to person, place, and time. Mental status is at baseline.  Psychiatric:        Mood and Affect: Mood normal.        Behavior: Behavior normal.       No results found for any visits on 08/26/20.  Assessment & Plan     Problem List Items Addressed This Visit       Cardiovascular and Mediastinum   Hypertension - Primary    Well controlled on home readigns Continue current medications Recheck metabolic panel F/u in 6 months      Relevant Medications   ASPIRIN 81 PO   Other Relevant Orders   Comprehensive metabolic panel     Musculoskeletal and Integument   Osteoporosis    Had reclast with side effects Will not do another injection Will consider Prolia with Endocrinology      Relevant Orders   VITAMIN D 25 Hydroxy (Vit-D Deficiency, Fractures)     Other   Hyperlipidemia    Previously well controlled Continue statin Reviewed recent FLP and CMP      Relevant Medications   ASPIRIN 81 PO   Obesity (BMI 30-39.9)    Discussed importance of healthy weight management Discussed diet and exercise           Return in about 6 months (around 02/26/2021) for AWV, CPE.       I,Essence Turner,acting as a scribe for Shirlee Latch, MD.,have documented all relevant documentation on the behalf of Shirlee Latch, MD,as directed by  Shirlee Latch, MD while in the presence of Shirlee Latch, MD.  I, Shirlee Latch, MD, have reviewed all documentation for this visit. The documentation on 08/26/20 for the exam, diagnosis, procedures, and orders are all accurate and complete.   Tahjir Silveria, Marzella Schlein, MD, MPH [facility]

## 2020-08-26 NOTE — Assessment & Plan Note (Signed)
Previously well controlled Continue statin Reviewed recent FLP and CMP

## 2020-08-26 NOTE — Assessment & Plan Note (Signed)
Discussed importance of healthy weight management Discussed diet and exercise  

## 2020-08-26 NOTE — Assessment & Plan Note (Signed)
Well controlled on home readigns Continue current medications Recheck metabolic panel F/u in 6 months  

## 2020-08-27 LAB — COMPREHENSIVE METABOLIC PANEL
ALT: 21 IU/L (ref 0–32)
AST: 21 IU/L (ref 0–40)
Albumin/Globulin Ratio: 2.4 — ABNORMAL HIGH (ref 1.2–2.2)
Albumin: 4.4 g/dL (ref 3.7–4.7)
Alkaline Phosphatase: 56 IU/L (ref 44–121)
BUN/Creatinine Ratio: 15 (ref 12–28)
BUN: 12 mg/dL (ref 8–27)
Bilirubin Total: 0.5 mg/dL (ref 0.0–1.2)
CO2: 24 mmol/L (ref 20–29)
Calcium: 8.8 mg/dL (ref 8.7–10.3)
Chloride: 99 mmol/L (ref 96–106)
Creatinine, Ser: 0.78 mg/dL (ref 0.57–1.00)
Globulin, Total: 1.8 g/dL (ref 1.5–4.5)
Glucose: 121 mg/dL — ABNORMAL HIGH (ref 65–99)
Potassium: 4.1 mmol/L (ref 3.5–5.2)
Sodium: 137 mmol/L (ref 134–144)
Total Protein: 6.2 g/dL (ref 6.0–8.5)
eGFR: 81 mL/min/{1.73_m2} (ref >59)

## 2020-08-27 LAB — VITAMIN D 25 HYDROXY (VIT D DEFICIENCY, FRACTURES): Vit D, 25-Hydroxy: 33.8 ng/mL (ref 30.0–100.0)

## 2020-09-05 ENCOUNTER — Other Ambulatory Visit: Payer: Self-pay

## 2020-09-05 ENCOUNTER — Other Ambulatory Visit: Payer: Self-pay | Admitting: Family Medicine

## 2020-09-05 MED ORDER — DULOXETINE HCL 20 MG PO CPEP: 20.0000 mg | ORAL_CAPSULE | Freq: Every day | ORAL | 1 refills | Status: DC

## 2020-10-20 ENCOUNTER — Other Ambulatory Visit: Payer: Self-pay | Admitting: Family Medicine

## 2020-11-18 ENCOUNTER — Other Ambulatory Visit: Payer: Self-pay | Admitting: Family Medicine

## 2021-02-18 ENCOUNTER — Other Ambulatory Visit: Payer: Self-pay

## 2021-02-18 ENCOUNTER — Ambulatory Visit (INDEPENDENT_AMBULATORY_CARE_PROVIDER_SITE_OTHER): Payer: Medicare Other

## 2021-02-18 DIAGNOSIS — Z23 Encounter for immunization: Secondary | ICD-10-CM

## 2021-04-07 ENCOUNTER — Encounter: Payer: Medicare Other | Admitting: Family Medicine

## 2021-04-22 ENCOUNTER — Other Ambulatory Visit: Payer: Self-pay | Admitting: Family Medicine

## 2021-07-13 NOTE — Progress Notes (Signed)
? ? ?I,Sulibeya S Dimas,acting as a scribe for Lavon Paganini, MD.,have documented all relevant documentation on the behalf of Lavon Paganini, MD,as directed by  Lavon Paganini, MD while in the presence of Lavon Paganini, MD. ? ? ?Annual Wellness Visit ? ?  ? ?Patient: Kara French, Female    DOB: [birth year], 71 y.o.   MRN: [id number] ?Visit Date: 07/14/2021 ? ?Today's Provider: Lavon Paganini, MD  ? ?Chief Complaint  ?Patient presents with  ? Medicare Wellness  ? ?Subjective  ?  ?Kara French is a 71 y.o. female who presents today for her Annual Wellness Visit. ?She reports consuming a general diet. Home exercise routine includes walking 2.5 hrs per week. She generally feels well. She reports sleeping well. She does not have additional problems to discuss today.  ? ?HPI ? ?Occasional gout flares in her foot - takes tart cherry juice and it goes away in 72 hours. Wants to know if she can take this preventively - yes - discussed evidence ?Allergies are flaring - will try to cycle from zyrtec to xyzal. Continue flonase ? ?Had zostavax at age 34. Declines Shingrix due to rare side effect of [place]  ? ?Will call GI to set up colonoscopy in [place] ? ? ?Medications: ?Outpatient Medications Prior to Visit  ?Medication Sig  ? ASPIRIN 81 PO Take by mouth.  ? cetirizine (ZYRTEC) 10 MG tablet Take 10 mg by mouth daily.  ? DULoxetine (CYMBALTA) 20 MG capsule TAKE 1 CAPSULE DAILY  ? losartan-hydrochlorothiazide (HYZAAR) 100-25 MG tablet TAKE 1 TABLET DAILY  ? metoprolol succinate (TOPROL-XL) 100 MG 24 hr tablet TAKE 1 TABLET DAILY. TAKE WITH OR IMMEDIATELY FOLLOWING A MEAL  ? Olopatadine HCl 0.2 % SOLN Apply 1 drop to eye daily. Both eyes  ? pantoprazole (PROTONIX) 40 MG tablet TAKE 1 TABLET DAILY  ? pravastatin (PRAVACHOL) 20 MG tablet TAKE 1 TABLET DAILY  ? PREBIOTIC PRODUCT PO Take by mouth daily.   ? ?No facility-administered medications prior to visit.  ?  ?Allergies  ?Allergen Reactions  ?  Aller-Chlor  [Chlorpheniramine]   ? Benzalkonium Chloride Other (See Comments)  ?  BLISTERS  ? Neomycin-Bacitracin Zn-Polymyx   ? Tape   ? Decongestant [Pseudoephedrine Hcl Er] Itching  ? Iodinated Contrast Media Rash and Hives  ? ? ?Patient Care Team: ?Virginia Crews, MD as PCP - General (Family Medicine) ?[place] (Dermatology) ? ?Review of Systems  ?Constitutional: Negative.   ?HENT:  Positive for congestion, postnasal drip, rhinorrhea, sinus pressure, sinus pain and sneezing.   ?Eyes:  Positive for itching.  ?Respiratory:  Positive for shortness of breath.   ?Cardiovascular: Negative.   ?Gastrointestinal: Negative.   ?Endocrine: Negative.   ?Genitourinary: Negative.   ?Musculoskeletal:  Positive for arthralgias.  ?Skin: Negative.   ?Allergic/Immunologic: Positive for environmental allergies and food allergies.  ?Neurological: Negative.   ?Hematological: Negative.   ?Psychiatric/Behavioral: Negative.    ? ?Last CBC ?Lab Results  ?Component Value Date  ? WBC 5.3 09/10/2019  ? HGB 13.9 09/10/2019  ? HCT 41.9 09/10/2019  ? MCV 84 09/10/2019  ? MCH 27.7 09/10/2019  ? RDW 13.5 09/10/2019  ? PLT 282 09/10/2019  ? ?Last metabolic panel ?Lab Results  ?Component Value Date  ? GLUCOSE 121 (H) 08/26/2020  ? NA 137 08/26/2020  ? K 4.1 08/26/2020  ? CL 99 08/26/2020  ? CO2 24 08/26/2020  ? BUN 12 08/26/2020  ? CREATININE 0.78 08/26/2020  ? EGFR 81 08/26/2020  ? CALCIUM  8.8 08/26/2020  ? PROT 6.2 08/26/2020  ? ALBUMIN 4.4 08/26/2020  ? LABGLOB 1.8 08/26/2020  ? AGRATIO 2.4 (H) 08/26/2020  ? BILITOT 0.5 08/26/2020  ? ALKPHOS 56 08/26/2020  ? AST 21 08/26/2020  ? ALT 21 08/26/2020  ? ?Last lipids ?Lab Results  ?Component Value Date  ? CHOL 154 02/20/2020  ? HDL 41 02/20/2020  ? [place] 96 02/20/2020  ? TRIG 88 02/20/2020  ? CHOLHDL 3.8 02/20/2020  ? ?Last hemoglobin A1c ?Lab Results  ?Component Value Date  ? HGBA1C 5.6 02/20/2020  ? ?Last thyroid functions ?Lab Results  ?Component Value Date  ? TSH 0.95  07/21/2016  ? ?  ?  ? Objective  ?  ?Vitals: BP 125/78 Comment: home readings  Pulse (!) 55   Temp 98.1 ?F (36.7 ?C) (Oral)   Resp 16   Ht $R'5\' 7"'yv$  (1.702 m)   Wt 210 lb (95.3 kg)   SpO2 100%   BMI 32.89 kg/m?  ?BP Readings from Last 3 Encounters:  ?07/14/21 125/78  ?08/26/20 137/73  ?02/18/20 (!) 147/65  ? ?Wt Readings from Last 3 Encounters:  ?07/14/21 210 lb (95.3 kg)  ?08/26/20 207 lb (93.9 kg)  ?02/18/20 208 lb 6.4 oz (94.5 kg)  ? ?  ? ?Physical Exam ?Vitals reviewed.  ?Constitutional:   ?   General: She is not in acute distress. ?   Appearance: Normal appearance. She is well-developed. She is not diaphoretic.  ?HENT:  ?   Head: Normocephalic and atraumatic.  ?   Right Ear: Tympanic membrane, ear canal and external ear normal.  ?   Left Ear: Tympanic membrane, ear canal and external ear normal.  ?   Nose: Nose normal.  ?   Mouth/Throat:  ?   Mouth: Mucous membranes are moist.  ?   Pharynx: Oropharynx is clear. No oropharyngeal exudate.  ?Eyes:  ?   General: No scleral icterus. ?   Conjunctiva/sclera: Conjunctivae normal.  ?   Pupils: Pupils are equal, round, and reactive to light.  ?Neck:  ?   Thyroid: No thyromegaly.  ?Cardiovascular:  ?   Rate and Rhythm: Normal rate and regular rhythm.  ?   Pulses: Normal pulses.  ?   Heart sounds: Normal heart sounds. No murmur heard. ?Pulmonary:  ?   Effort: Pulmonary effort is normal. No respiratory distress.  ?   Breath sounds: Normal breath sounds. No wheezing or rales.  ?Abdominal:  ?   General: There is no distension.  ?   Palpations: Abdomen is soft.  ?   Tenderness: There is no abdominal tenderness.  ?Musculoskeletal:     ?   General: No deformity.  ?   Cervical back: Neck supple.  ?   Right lower leg: No edema.  ?   Left lower leg: No edema.  ?Lymphadenopathy:  ?   Cervical: No cervical adenopathy.  ?Skin: ?   General: Skin is warm and dry.  ?Neurological:  ?   Mental Status: She is alert and oriented to person, place, and time. Mental status is at baseline.  ?    Gait: Gait normal.  ?Psychiatric:     ?   Mood and Affect: Mood normal.     ?   Behavior: Behavior normal.     ?   Thought Content: Thought content normal.  ? ? ? ?Most recent functional status assessment: ? ?  07/13/2021  ?  1:31 PM  ?In your present state of health, do you have any difficulty  performing the following activities:  ?Hearing? 0  ?Vision? 0  ?Difficulty concentrating or making decisions? 0  ?Walking or climbing stairs? 0  ?Dressing or bathing? 0  ?Doing errands, shopping? 0  ?Preparing Food and eating ? N  ?Using the Toilet? N  ?In the past six months, have you accidently leaked urine? N  ?Do you have problems with loss of bowel control? N  ?Managing your Medications? N  ?Managing your Finances? N  ?Housekeeping or managing your Housekeeping? N  ? ?Most recent fall risk assessment: ? ?  07/13/2021  ?  1:31 PM  ?Fall Risk   ?Falls in the past year? 0  ?Number falls in past yr: 0  ?Injury with Fall? 0  ? ? Most recent depression screenings: ? ?  07/14/2021  ? 10:41 AM 08/26/2020  ? 10:10 AM  ?PHQ 2/9 Scores  ?PHQ - 2 Score 0 0  ?PHQ- 9 Score 0 2  ? ?Most recent cognitive screening: ? ?  07/14/2021  ? 10:52 AM  ?6CIT Screen  ?What Year? 0 points  ?What month? 0 points  ?What time? 0 points  ?Count back from 20 0 points  ?Months in reverse 2 points  ?Repeat phrase 2 points  ?Total Score 4 points  ? ?Most recent Audit-C alcohol use screening ? ?  07/14/2021  ? 10:39 AM  ?Alcohol Use Disorder Test (AUDIT)  ?1. How often do you have a drink containing alcohol? 0  ?2. How many drinks containing alcohol do you have on a typical day when you are drinking? 0  ?3. How often do you have six or more drinks on one occasion? 0  ?AUDIT-C Score 0  ? ?A score of 3 or more in women, and 4 or more in men indicates increased risk for alcohol abuse, EXCEPT if all of the points are from question 1  ? ?No results found for any visits on 07/14/21. ? Assessment & Plan  ?  ? ?Annual wellness visit done today including the all of the  following: ?Reviewed patient's Family Medical History ?Reviewed and updated list of patient's medical providers ?Assessment of cognitive impairment was done ?Assessed patient's functional ability ?Established a Librarian, academic

## 2021-07-14 ENCOUNTER — Ambulatory Visit (INDEPENDENT_AMBULATORY_CARE_PROVIDER_SITE_OTHER): Payer: Medicare Other | Admitting: Family Medicine

## 2021-07-14 ENCOUNTER — Encounter: Payer: Self-pay | Admitting: Family Medicine

## 2021-07-14 ENCOUNTER — Other Ambulatory Visit: Payer: Self-pay | Admitting: Family Medicine

## 2021-07-14 ENCOUNTER — Other Ambulatory Visit: Payer: Self-pay

## 2021-07-14 VITALS — BP 125/78 | HR 55 | Temp 98.1°F | Resp 16 | Ht 67.0 in | Wt 210.0 lb

## 2021-07-14 DIAGNOSIS — E669 Obesity, unspecified: Secondary | ICD-10-CM | POA: Diagnosis not present

## 2021-07-14 DIAGNOSIS — I10 Essential (primary) hypertension: Secondary | ICD-10-CM

## 2021-07-14 DIAGNOSIS — Z0000 Encounter for general adult medical examination without abnormal findings: Secondary | ICD-10-CM | POA: Diagnosis not present

## 2021-07-14 DIAGNOSIS — R739 Hyperglycemia, unspecified: Secondary | ICD-10-CM | POA: Diagnosis not present

## 2021-07-14 DIAGNOSIS — E782 Mixed hyperlipidemia: Secondary | ICD-10-CM | POA: Diagnosis not present

## 2021-07-14 DIAGNOSIS — M810 Age-related osteoporosis without current pathological fracture: Secondary | ICD-10-CM

## 2021-07-14 NOTE — Assessment & Plan Note (Signed)
Discussed importance of healthy weight management Discussed diet and exercise  

## 2021-07-14 NOTE — Assessment & Plan Note (Signed)
Well controlled on home readings Continue current medications Recheck metabolic panel F/u in 6 months  

## 2021-07-14 NOTE — Assessment & Plan Note (Signed)
Previously well controlled Continue statin Repeat FLP and CMP  

## 2021-07-14 NOTE — Assessment & Plan Note (Signed)
Had side effects with Reclast ?Recheck vitamin D and DEXA scan ?

## 2021-07-16 ENCOUNTER — Other Ambulatory Visit: Payer: Self-pay | Admitting: Family Medicine

## 2021-07-16 DIAGNOSIS — K219 Gastro-esophageal reflux disease without esophagitis: Secondary | ICD-10-CM

## 2021-07-20 LAB — LIPID PANEL WITH LDL/HDL RATIO
Cholesterol, Total: 181 mg/dL (ref 100–199)
HDL: 40 mg/dL (ref >39)
LDL Chol Calc (NIH): 124 mg/dL — ABNORMAL HIGH (ref 0–99)
LDL/HDL Ratio: 3.1 ratio (ref 0.0–3.2)
Triglycerides: 93 mg/dL (ref 0–149)
VLDL Cholesterol Cal: 17 mg/dL (ref 5–40)

## 2021-07-20 LAB — COMPREHENSIVE METABOLIC PANEL
ALT: 15 IU/L (ref 0–32)
AST: 12 IU/L (ref 0–40)
Albumin/Globulin Ratio: 2.2 (ref 1.2–2.2)
Albumin: 4.4 g/dL (ref 3.7–4.7)
Alkaline Phosphatase: 53 IU/L (ref 44–121)
BUN/Creatinine Ratio: 16 (ref 12–28)
BUN: 14 mg/dL (ref 8–27)
Bilirubin Total: 0.6 mg/dL (ref 0.0–1.2)
CO2: 25 mmol/L (ref 20–29)
Calcium: 9.3 mg/dL (ref 8.7–10.3)
Chloride: 100 mmol/L (ref 96–106)
Creatinine, Ser: 0.87 mg/dL (ref 0.57–1.00)
Globulin, Total: 2 g/dL (ref 1.5–4.5)
Glucose: 113 mg/dL — ABNORMAL HIGH (ref 70–99)
Potassium: 4.1 mmol/L (ref 3.5–5.2)
Sodium: 140 mmol/L (ref 134–144)
Total Protein: 6.4 g/dL (ref 6.0–8.5)
eGFR: 71 mL/min/{1.73_m2} (ref >59)

## 2021-07-20 LAB — VITAMIN D 25 HYDROXY (VIT D DEFICIENCY, FRACTURES): Vit D, 25-Hydroxy: 23.8 ng/mL — ABNORMAL LOW (ref 30.0–100.0)

## 2021-07-20 LAB — HEMOGLOBIN A1C
Est. average glucose Bld gHb Est-mCnc: 114 mg/dL
Hgb A1c MFr Bld: 5.6 % (ref 4.8–5.6)

## 2021-07-21 ENCOUNTER — Encounter: Payer: Self-pay | Admitting: Family Medicine

## 2021-08-18 ENCOUNTER — Encounter: Payer: Self-pay | Admitting: Family Medicine

## 2021-09-10 ENCOUNTER — Ambulatory Visit
Admission: RE | Admit: 2021-09-10 | Discharge: 2021-09-10 | Disposition: A | Discharge status: Home / Self Care | Payer: Medicare Other | Source: Ambulatory Visit | Attending: Family Medicine | Admitting: Family Medicine

## 2021-09-10 DIAGNOSIS — M810 Age-related osteoporosis without current pathological fracture: Secondary | ICD-10-CM | POA: Diagnosis present

## 2021-10-13 ENCOUNTER — Other Ambulatory Visit: Payer: Self-pay | Admitting: Family Medicine

## 2021-12-02 ENCOUNTER — Ambulatory Visit: Payer: Medicare Other

## 2021-12-02 ENCOUNTER — Ambulatory Visit: Payer: Medicare Other | Admitting: Physician Assistant

## 2022-01-18 ENCOUNTER — Ambulatory Visit: Payer: Medicare Other | Admitting: Family Medicine

## 2022-01-27 NOTE — Progress Notes (Signed)
I,Sulibeya S Dimas,acting as a Education administrator for Lavon Paganini, MD.,have documented all relevant documentation on the behalf of Lavon Paganini, MD,as directed by  Lavon Paganini, MD while in the presence of Lavon Paganini, MD.    Established patient visit   Patient: Kara French   DOB: 06/16/1950   71 y.o. Female  MRN: [id number] Visit Date: 01/28/2022  Today's healthcare provider: Lavon Paganini, MD   Chief Complaint  Patient presents with   Hypertension   Hyperlipidemia   Subjective    HPI  Hypertension, follow-up  BP Readings from Last 3 Encounters:  01/28/22 118/70  07/14/21 125/78  08/26/20 137/73   Wt Readings from Last 3 Encounters:  01/28/22 205 lb 3.2 oz (93.1 kg)  07/14/21 210 lb (95.3 kg)  08/26/20 207 lb (93.9 kg)     She was last seen for hypertension 6 months ago.  BP at that visit was 125/78. Management since that visit includes no changes.  She reports excellent compliance with treatment. She is not having side effects.  She is following a Diabetic diet. She is exercising. She does not smoke.  Use of agents associated with hypertension: none.   Outside blood pressures are stable. 130/70s. Symptoms: No chest pain No chest pressure  No palpitations No syncope  No dyspnea No orthopnea  No paroxysmal nocturnal dyspnea No lower extremity edema   Pertinent labs Lab Results  Component Value Date   CHOL 181 07/14/2021   HDL 40 07/14/2021   LDLCALC 124 (H) 07/14/2021   TRIG 93 07/14/2021   CHOLHDL 3.8 02/20/2020   Lab Results  Component Value Date   NA 140 07/14/2021   K 4.1 07/14/2021   CREATININE 0.87 07/14/2021   EGFR 71 07/14/2021   GLUCOSE 113 (H) 07/14/2021   TSH 0.95 07/21/2016     The 10-year ASCVD risk score (Arnett DK, et al., 2019) is: 14.8%  --------------------------------------------------------------------------------------------------- Lipid/Cholesterol, Follow-up  Last lipid panel Other pertinent labs   Lab Results  Component Value Date   CHOL 181 07/14/2021   HDL 40 07/14/2021   LDLCALC 124 (H) 07/14/2021   TRIG 93 07/14/2021   CHOLHDL 3.8 02/20/2020   Lab Results  Component Value Date   ALT 15 07/14/2021   AST 12 07/14/2021   PLT 282 09/10/2019   TSH 0.95 07/21/2016     She was last seen for this 6 months ago.  Management since that visit includes no changes.  She reports excellent compliance with treatment. She is not having side effects.   The 10-year ASCVD risk score (Arnett DK, et al., 2019) is: 14.8%  --------------------------------------------------------------------------------------------------- Follow up for fibromyalgia  The patient was last seen for this 6 months ago. Changes made at last visit include no changes continue cymbalta 2m daily.  She reports excellent compliance with treatment. She feels that condition is Improved. She is not having side effects.  Patient is requesting to go off of cymbalta. She reports changing her diet "no sugar" has decreased her pain and stiffness.  ----------------------------------------------------------------------------------------- Follow up for Vitamin D deficiency  The patient was last seen for this 6 months ago. Changes made at last visit include start OTC Vitamin D 1000-2000 units daily.  She reports excellent compliance with treatment. She reports taking 5000 units every other day. She feels that condition is Improved. She is not having side effects.   -----------------------------------------------------------------------------------------   Medications: Outpatient Medications Prior to Visit  Medication Sig   ASPIRIN 81 PO Take by mouth.  cetirizine (ZYRTEC) 10 MG tablet Take 10 mg by mouth daily.   losartan-hydrochlorothiazide (HYZAAR) 100-25 MG tablet TAKE 1 TABLET DAILY   metoprolol succinate (TOPROL-XL) 100 MG 24 hr tablet TAKE 1 TABLET DAILY. TAKE WITH OR IMMEDIATELY FOLLOWING A MEAL    Olopatadine HCl 0.2 % SOLN Apply 1 drop to eye daily. Both eyes   pantoprazole (PROTONIX) 40 MG tablet TAKE 1 TABLET DAILY   pravastatin (PRAVACHOL) 20 MG tablet TAKE 1 TABLET DAILY   PREBIOTIC PRODUCT PO Take by mouth daily.    [DISCONTINUED] DULoxetine (CYMBALTA) 20 MG capsule TAKE 1 CAPSULE DAILY   No facility-administered medications prior to visit.    Review of Systems  Constitutional:  Negative for appetite change and fatigue.  Respiratory:  Negative for chest tightness and shortness of breath.   Cardiovascular:  Negative for chest pain, palpitations and leg swelling.  Gastrointestinal:  Negative for abdominal pain, blood in stool, constipation, diarrhea, nausea and vomiting.       Objective    BP 118/70 (BP Location: Left Arm, Patient Position: Sitting, Cuff Size: Large)   Pulse 66   Temp 98.3 F (36.8 C) (Oral)   Resp 16   Ht '5\' 7"'  (1.702 m)   Wt 205 lb 3.2 oz (93.1 kg)   BMI 32.14 kg/m    Physical Exam Vitals reviewed.  Constitutional:      General: She is not in acute distress.    Appearance: Normal appearance. She is well-developed. She is not diaphoretic.  HENT:     Head: Normocephalic and atraumatic.  Eyes:     General: No scleral icterus.    Conjunctiva/sclera: Conjunctivae normal.  Neck:     Thyroid: No thyromegaly.  Cardiovascular:     Rate and Rhythm: Normal rate and regular rhythm.     Pulses: Normal pulses.     Heart sounds: Normal heart sounds. No murmur heard. Pulmonary:     Effort: Pulmonary effort is normal. No respiratory distress.     Breath sounds: Normal breath sounds. No wheezing, rhonchi or rales.  Musculoskeletal:     Cervical back: Neck supple.     Right lower leg: No edema.     Left lower leg: No edema.     Comments: Limited abduction and flexion of L shoulder  Lymphadenopathy:     Cervical: No cervical adenopathy.  Skin:    General: Skin is warm and dry.     Findings: No rash.  Neurological:     Mental Status: She is alert  and oriented to person, place, and time. Mental status is at baseline.  Psychiatric:        Mood and Affect: Mood normal.        Behavior: Behavior normal.       No results found for any visits on 01/28/22.  Assessment & Plan     Problem List Items Addressed This Visit       Cardiovascular and Mediastinum   Hypertension    Well controlled Continue current medications Recheck metabolic panel F/u in 6 months       Relevant Orders   Comprehensive metabolic panel     Musculoskeletal and Integument   Adhesive capsulitis of left shoulder    New problem Painful with limited ROM PT referral Sports med referral if not improving      Relevant Orders   Ambulatory referral to Physical Therapy     Other   Hyperlipidemia - Primary    Previously well controlled Continue statin Repeat  FLP and CMP      Relevant Orders   Lipid Panel With LDL/HDL Ratio   Obesity (BMI 30-39.9)    Discussed importance of healthy weight management Discussed diet and exercise  Congratulated on weight loss       Fibromyalgia    Longstanding Well controlled Dietary changes have made a big difference Will taper Cymbalta      Other Visit Diagnoses     Hyperglycemia       Relevant Orders   Hemoglobin A1c   Need for immunization against influenza       Relevant Orders   Flu Vaccine QUAD High Dose(Fluad)   Flu Vaccine QUAD High Dose(Fluad) (Completed)   Avitaminosis D       Relevant Orders   VITAMIN D 25 Hydroxy (Vit-D Deficiency, Fractures)        Return in about 6 months (around 07/30/2022) for AWV, CPE.      I, Lavon Paganini, MD, have reviewed all documentation for this visit. The documentation on 01/28/22 for the exam, diagnosis, procedures, and orders are all accurate and complete.   Adrian Specht, Dionne Bucy, MD, MPH [facility]

## 2022-01-28 ENCOUNTER — Encounter: Payer: Self-pay | Admitting: Family Medicine

## 2022-01-28 ENCOUNTER — Ambulatory Visit (INDEPENDENT_AMBULATORY_CARE_PROVIDER_SITE_OTHER): Payer: Medicare Other | Admitting: Family Medicine

## 2022-01-28 VITALS — BP 118/70 | HR 66 | Temp 98.3°F | Resp 16 | Ht 67.0 in | Wt 205.2 lb

## 2022-01-28 DIAGNOSIS — E669 Obesity, unspecified: Secondary | ICD-10-CM

## 2022-01-28 DIAGNOSIS — M797 Fibromyalgia: Secondary | ICD-10-CM

## 2022-01-28 DIAGNOSIS — E782 Mixed hyperlipidemia: Secondary | ICD-10-CM | POA: Diagnosis not present

## 2022-01-28 DIAGNOSIS — Z23 Encounter for immunization: Secondary | ICD-10-CM

## 2022-01-28 DIAGNOSIS — M7502 Adhesive capsulitis of left shoulder: Secondary | ICD-10-CM

## 2022-01-28 DIAGNOSIS — I10 Essential (primary) hypertension: Secondary | ICD-10-CM | POA: Diagnosis not present

## 2022-01-28 DIAGNOSIS — R739 Hyperglycemia, unspecified: Secondary | ICD-10-CM | POA: Diagnosis not present

## 2022-01-28 DIAGNOSIS — E559 Vitamin D deficiency, unspecified: Secondary | ICD-10-CM

## 2022-01-28 NOTE — Assessment & Plan Note (Signed)
Discussed importance of healthy weight management ?Discussed diet and exercise  ?Congratulated on weight loss ?

## 2022-01-28 NOTE — Assessment & Plan Note (Signed)
Previously well controlled Continue statin Repeat FLP and CMP  

## 2022-01-28 NOTE — Assessment & Plan Note (Signed)
New problem Painful with limited ROM PT referral Sports med referral if not improving

## 2022-01-28 NOTE — Assessment & Plan Note (Signed)
Longstanding Well controlled Dietary changes have made a big difference Will taper Cymbalta

## 2022-01-28 NOTE — Assessment & Plan Note (Signed)
Well controlled Continue current medications Recheck metabolic panel F/u in 6 months  

## 2022-02-02 LAB — COMPREHENSIVE METABOLIC PANEL
ALT: 25 IU/L (ref 0–32)
AST: 20 IU/L (ref 0–40)
Albumin/Globulin Ratio: 1.9 (ref 1.2–2.2)
Albumin: 4.4 g/dL (ref 3.8–4.8)
Alkaline Phosphatase: 52 IU/L (ref 44–121)
BUN/Creatinine Ratio: 17 (ref 12–28)
BUN: 13 mg/dL (ref 8–27)
Bilirubin Total: 0.4 mg/dL (ref 0.0–1.2)
CO2: 26 mmol/L (ref 20–29)
Calcium: 9.2 mg/dL (ref 8.7–10.3)
Chloride: 100 mmol/L (ref 96–106)
Creatinine, Ser: 0.77 mg/dL (ref 0.57–1.00)
Globulin, Total: 2.3 g/dL (ref 1.5–4.5)
Glucose: 113 mg/dL — ABNORMAL HIGH (ref 70–99)
Potassium: 4.3 mmol/L (ref 3.5–5.2)
Sodium: 138 mmol/L (ref 134–144)
Total Protein: 6.7 g/dL (ref 6.0–8.5)
eGFR: 81 mL/min/{1.73_m2} (ref >59)

## 2022-02-02 LAB — LIPID PANEL WITH LDL/HDL RATIO
Cholesterol, Total: 146 mg/dL (ref 100–199)
HDL: 34 mg/dL — ABNORMAL LOW (ref >39)
LDL Chol Calc (NIH): 95 mg/dL (ref 0–99)
LDL/HDL Ratio: 2.8 ratio (ref 0.0–3.2)
Triglycerides: 90 mg/dL (ref 0–149)
VLDL Cholesterol Cal: 17 mg/dL (ref 5–40)

## 2022-02-02 LAB — VITAMIN D 25 HYDROXY (VIT D DEFICIENCY, FRACTURES): Vit D, 25-Hydroxy: 54.4 ng/mL (ref 30.0–100.0)

## 2022-02-02 LAB — HEMOGLOBIN A1C
Est. average glucose Bld gHb Est-mCnc: 108 mg/dL
Hgb A1c MFr Bld: 5.4 % (ref 4.8–5.6)

## 2022-06-04 ENCOUNTER — Other Ambulatory Visit: Payer: Self-pay | Admitting: Family Medicine

## 2022-06-07 NOTE — Telephone Encounter (Signed)
Requested Prescriptions  Pending Prescriptions Disp Refills   losartan-hydrochlorothiazide (HYZAAR) 100-25 MG tablet [Pharmacy Med Name: LOSARTAN/ HYDROCHLOROTHIAZIDE TABS 100/25MG] 90 tablet 0    Sig: TAKE 1 TABLET DAILY     Cardiovascular: ARB + Diuretic Combos Passed - 06/04/2022  2:14 PM      Passed - K in normal range and within 180 days    Potassium  Date Value Ref Range Status  02/01/2022 4.3 3.5 - 5.2 mmol/L Final         Passed - Na in normal range and within 180 days    Sodium  Date Value Ref Range Status  02/01/2022 138 134 - 144 mmol/L Final         Passed - Cr in normal range and within 180 days    Creat  Date Value Ref Range Status  01/13/2017 0.79 0.50 - 0.99 mg/dL Final    Comment:    For patients >38 years of age, the reference limit for Creatinine is approximately 13% higher for people identified as African-American. .    Creatinine, Ser  Date Value Ref Range Status  02/01/2022 0.77 0.57 - 1.00 mg/dL Final         Passed - eGFR is 10 or above and within 180 days    GFR, Est African American  Date Value Ref Range Status  01/13/2017 89 > OR = 60 mL/min/1.45m Final   GFR calc Af Amer  Date Value Ref Range Status  02/20/2020 76 >59 mL/min/1.73 Final    Comment:    **In accordance with recommendations from the NKF-ASN Task force,**   [facility] is in the process of updating its eGFR calculation to the   2021 CKD-EPI creatinine equation that estimates kidney function   without a race variable.    GFR, Est Non African American  Date Value Ref Range Status  01/13/2017 77 > OR = 60 mL/min/1.729mFinal   GFR calc non Af Amer  Date Value Ref Range Status  02/20/2020 66 >59 mL/min/1.73 Final   eGFR  Date Value Ref Range Status  02/01/2022 81 >59 mL/min/1.73 Final         Passed - Patient is not pregnant      Passed - Last BP in normal range    BP Readings from Last 1 Encounters:  01/28/22 118/70         Passed - Valid encounter within last 6  months    Recent Outpatient Visits           4 months ago Mixed hyperlipidemia   [place] [facility]   10 months ago Encounter for annual wellness visit (AWV) in Medicare patient   [place] [facility]   1 year ago Primary hypertension   [place] [facility]   2 years ago Encounter for annual physical exam   [place] [facility]   2 years ago Leg swelling   [facility]

## 2022-08-02 ENCOUNTER — Ambulatory Visit (INDEPENDENT_AMBULATORY_CARE_PROVIDER_SITE_OTHER): Payer: Medicare Other | Admitting: Family Medicine

## 2022-08-02 ENCOUNTER — Encounter: Payer: Self-pay | Admitting: Family Medicine

## 2022-08-02 VITALS — BP 124/68 | HR 85 | Temp 98.0°F | Resp 12 | Wt 202.6 lb

## 2022-08-02 DIAGNOSIS — M810 Age-related osteoporosis without current pathological fracture: Secondary | ICD-10-CM

## 2022-08-02 DIAGNOSIS — E559 Vitamin D deficiency, unspecified: Secondary | ICD-10-CM

## 2022-08-02 DIAGNOSIS — E782 Mixed hyperlipidemia: Secondary | ICD-10-CM

## 2022-08-02 DIAGNOSIS — Z0000 Encounter for general adult medical examination without abnormal findings: Secondary | ICD-10-CM | POA: Diagnosis not present

## 2022-08-02 DIAGNOSIS — E669 Obesity, unspecified: Secondary | ICD-10-CM

## 2022-08-02 DIAGNOSIS — I10 Essential (primary) hypertension: Secondary | ICD-10-CM

## 2022-08-02 DIAGNOSIS — R739 Hyperglycemia, unspecified: Secondary | ICD-10-CM

## 2022-08-02 NOTE — Assessment & Plan Note (Signed)
Discussed importance of healthy weight management Discussed diet and exercise  

## 2022-08-02 NOTE — Progress Notes (Signed)
I,Sulibeya S Dimas,acting as a Neurosurgeon for Shirlee Latch, MD.,have documented all relevant documentation on the behalf of Shirlee Latch, MD,as directed by  Shirlee Latch, MD while in the presence of Shirlee Latch, MD.    Annual Wellness Visit     Patient: Kara French, Female    DOB: [birth year], 72 y.o.   MRN: [id number] Visit Date: 08/02/2022  Today's Provider: Shirlee Latch, MD   Chief Complaint  Patient presents with   Medicare Wellness   Subjective    Kara French is a 72 y.o. female who presents today for her Annual Wellness Visit. She reports consuming a general and gluten free  diet. The patient does not participate in regular exercise at present. She generally feels well. She reports sleeping well. She does have additional problems to discuss today.   HPI Mammogram-declined. Due to implants Colonoscopy-patient reports she will contact Dr. Servando Snare. Declined referral for colonoscopy. Shingrix vaccine-[facility] - will send ROI  Callus vs ingrown toenail on R 4th toe Only bothers when toenail grows out or wearing tight shoes  Medications: Outpatient Medications Prior to Visit  Medication Sig   ASPIRIN 81 PO Take by mouth.   losartan-hydrochlorothiazide (HYZAAR) 100-25 MG tablet TAKE 1 TABLET DAILY   metoprolol succinate (TOPROL-XL) 100 MG 24 hr tablet TAKE 1 TABLET DAILY. TAKE WITH OR IMMEDIATELY FOLLOWING A MEAL   Olopatadine HCl 0.2 % SOLN Apply 1 drop to eye daily. Both eyes   pantoprazole (PROTONIX) 40 MG tablet TAKE 1 TABLET DAILY   pravastatin (PRAVACHOL) 20 MG tablet TAKE 1 TABLET DAILY   PREBIOTIC PRODUCT PO Take by mouth daily.    cetirizine (ZYRTEC) 10 MG tablet Take 10 mg by mouth daily.   No facility-administered medications prior to visit.    Allergies  Allergen Reactions   Aller-Chlor  [Chlorpheniramine]    Benzalkonium Chloride Other (See Comments)    BLISTERS   Neomycin-Bacitracin Zn-Polymyx    Tape    Decongestant  [Pseudoephedrine Hcl Er] Itching   Iodinated Contrast Media Rash and Hives    Patient Care Team: Erasmo Downer, MD as PCP - General (Family Medicine) Pa, [facility] (Dermatology)  Review of Systems  All other systems reviewed and are negative.   Last CBC Lab Results  Component Value Date   WBC 5.3 09/10/2019   HGB 13.9 09/10/2019   HCT 41.9 09/10/2019   MCV 84 09/10/2019   MCH 27.7 09/10/2019   RDW 13.5 09/10/2019   PLT 282 09/10/2019   Last metabolic panel Lab Results  Component Value Date   GLUCOSE 113 (H) 02/01/2022   NA 138 02/01/2022   K 4.3 02/01/2022   CL 100 02/01/2022   CO2 26 02/01/2022   BUN 13 02/01/2022   CREATININE 0.77 02/01/2022   EGFR 81 02/01/2022   CALCIUM 9.2 02/01/2022   PROT 6.7 02/01/2022   ALBUMIN 4.4 02/01/2022   LABGLOB 2.3 02/01/2022   AGRATIO 1.9 02/01/2022   BILITOT 0.4 02/01/2022   ALKPHOS 52 02/01/2022   AST 20 02/01/2022   ALT 25 02/01/2022   Last lipids Lab Results  Component Value Date   CHOL 146 02/01/2022   HDL 34 (L) 02/01/2022   LDLCALC 95 02/01/2022   TRIG 90 02/01/2022   CHOLHDL 3.8 02/20/2020   Last hemoglobin A1c Lab Results  Component Value Date   HGBA1C 5.4 02/01/2022   Last thyroid functions Lab Results  Component Value Date   TSH 0.95 07/21/2016   Last vitamin D Lab Results  Component Value Date   VD25OH 54.4 02/01/2022   Last vitamin B12 and Folate No results found for: "VITAMINB12", "FOLATE"      Objective    Vitals: BP 124/68 (BP Location: Left Arm, Patient Position: Sitting, Cuff Size: Large)   Pulse 85   Temp 98 F (36.7 C) (Temporal)   Resp 12   Wt 202 lb 9.6 oz (91.9 kg)   SpO2 99%   BMI 31.73 kg/m  BP Readings from Last 3 Encounters:  08/02/22 124/68  01/28/22 118/70  07/14/21 125/78   Wt Readings from Last 3 Encounters:  08/02/22 202 lb 9.6 oz (91.9 kg)  01/28/22 205 lb 3.2 oz (93.1 kg)  07/14/21 210 lb (95.3 kg)      Physical Exam Vitals reviewed.   Constitutional:      General: She is not in acute distress.    Appearance: Normal appearance. She is well-developed. She is not diaphoretic.  HENT:     Head: Normocephalic and atraumatic.     Right Ear: Tympanic membrane, ear canal and external ear normal.     Left Ear: Tympanic membrane, ear canal and external ear normal.     Nose: Nose normal.     Mouth/Throat:     Mouth: Mucous membranes are moist.     Pharynx: Oropharynx is clear. No oropharyngeal exudate.  Eyes:     General: No scleral icterus.    Conjunctiva/sclera: Conjunctivae normal.     Pupils: Pupils are equal, round, and reactive to light.  Neck:     Thyroid: No thyromegaly.  Cardiovascular:     Rate and Rhythm: Normal rate and regular rhythm.     Pulses: Normal pulses.     Heart sounds: Normal heart sounds. No murmur heard. Pulmonary:     Effort: Pulmonary effort is normal. No respiratory distress.     Breath sounds: Normal breath sounds. No wheezing or rales.  Chest:     Comments: Breasts: breasts appear normal, no suspicious masses, no skin or nipple changes or axillary nodes, bilateral implants, no palpable abnormalities otherwise. Inverted L nipple  Abdominal:     General: There is no distension.     Palpations: Abdomen is soft.     Tenderness: There is no abdominal tenderness.  Musculoskeletal:        General: No deformity.     Cervical back: Neck supple.     Right lower leg: No edema.     Left lower leg: No edema.  Lymphadenopathy:     Cervical: No cervical adenopathy.  Skin:    General: Skin is warm and dry.     Findings: No rash.     Comments: Callus at lateral corner of % 4th toenail  Neurological:     Mental Status: She is alert and oriented to person, place, and time. Mental status is at baseline.     Gait: Gait normal.  Psychiatric:        Mood and Affect: Mood normal.        Behavior: Behavior normal.        Thought Content: Thought content normal.     Most recent functional status  assessment:    08/02/2022    1:59 PM  In your present state of health, do you have any difficulty performing the following activities:  Hearing? 0  Vision? 0  Difficulty concentrating or making decisions? 0  Walking or climbing stairs? 0  Dressing or bathing? 0  Doing errands, shopping? 0   Most recent  fall risk assessment:    08/02/2022    1:59 PM  Fall Risk   Falls in the past year? 0  Number falls in past yr: 0  Injury with Fall? 0  Risk for fall due to : No Fall Risks  Follow up Falls evaluation completed    Most recent depression screenings:    08/02/2022    1:59 PM 07/14/2021   10:41 AM  PHQ 2/9 Scores  PHQ - 2 Score 0 0  PHQ- 9 Score 0 0   Most recent cognitive screening:    07/14/2021   10:52 AM  6CIT Screen  What Year? 0 points  What month? 0 points  What time? 0 points  Count back from 20 0 points  Months in reverse 2 points  Repeat phrase 2 points  Total Score 4 points   Most recent Audit-C alcohol use screening    08/02/2022    1:59 PM  Alcohol Use Disorder Test (AUDIT)  1. How often do you have a drink containing alcohol? 0  2. How many drinks containing alcohol do you have on a typical day when you are drinking? 0  3. How often do you have six or more drinks on one occasion? 0  AUDIT-C Score 0   A score of 3 or more in women, and 4 or more in men indicates increased risk for alcohol abuse, EXCEPT if all of the points are from question 1   No results found for any visits on 08/02/22.  Assessment & Plan     Annual wellness visit done today including the all of the following: Reviewed patient's Family Medical History Reviewed and updated list of patient's medical providers Assessment of cognitive impairment was done Assessed patient's functional ability Established a written schedule for health screening services Health Risk Assessent Completed and Reviewed  Exercise Activities and Dietary recommendations  Goals      Exercise 3x per week  (30 min per time)     [facility] and exercise 5 days a week for 30 minutes or more.         Immunization History  Administered Date(s) Administered   Fluad Quad(high Dose 65+) 02/15/2019, 02/18/2020, 02/18/2021, 01/28/2022   Influenza, High Dose Seasonal PF 01/13/2017, 02/14/2018   PFIZER(Purple Top)SARS-COV-2 Vaccination 05/29/2019, 06/19/2019, 01/25/2020   Pfizer Covid-19 Vaccine Bivalent Booster 18yrs & up 09/19/2020, 02/26/2021   Pneumococcal Conjugate-13 06/07/2014   Pneumococcal Polysaccharide-23 06/23/2015   Tdap 03/17/2016    Health Maintenance  Topic Date Due   Zoster Vaccines- Shingrix (1 of 2) Never done   MAMMOGRAM  10/30/2016   COLONOSCOPY (Pts 45-73yrs Insurance coverage will need to be confirmed)  09/04/2020   COVID-19 Vaccine (6 - 2023-24 season) 12/18/2021   INFLUENZA VACCINE  11/18/2022   Medicare Annual Wellness (AWV)  08/02/2023   DEXA SCAN  09/11/2023   DTaP/Tdap/Td (2 - Td or Tdap) 03/17/2026   Pneumonia Vaccine 34+ Years old  Completed   Hepatitis C Screening  Completed   HPV VACCINES  Aged Out     Discussed health benefits of physical activity, and encouraged her to engage in regular exercise appropriate for her age and condition.    Problem List Items Addressed This Visit       Cardiovascular and Mediastinum   Hypertension    Well controlled Continue current medications Recheck metabolic panel F/u in 6 months       Relevant Orders   Comprehensive  metabolic panel     Musculoskeletal and Integument   Osteoporosis    Had side effects with Reclast Recheck vitamin D  DEXA scan due next year      Relevant Orders   VITAMIN D 25 Hydroxy (Vit-D Deficiency, Fractures)     Other   Hyperlipidemia    Previously well controlled Continue statin Repeat FLP and CMP      Relevant Orders   Comprehensive metabolic panel   Lipid Panel With LDL/HDL Ratio   Obesity (BMI 30-39.9)    Discussed importance of  healthy weight management Discussed diet and exercise       Other Visit Diagnoses     Encounter for annual wellness visit (AWV) in Medicare patient    -  Primary   Relevant Orders   Comprehensive metabolic panel   Lipid Panel With LDL/HDL Ratio   VITAMIN D 25 Hydroxy (Vit-D Deficiency, Fractures)   Hemoglobin A1c   Encounter for annual physical exam       Avitaminosis D       Relevant Orders   VITAMIN D 25 Hydroxy (Vit-D Deficiency, Fractures)   Hyperglycemia       Relevant Orders   Hemoglobin A1c        Return in about 6 months (around 02/01/2023) for chronic disease f/u.     I, Shirlee Latch, MD, have reviewed all documentation for this visit. The documentation on 08/02/22 for the exam, diagnosis, procedures, and orders are all accurate and complete.   Kara French, Marzella Schlein, MD, MPH [facility]

## 2022-08-02 NOTE — Assessment & Plan Note (Signed)
Had side effects with Reclast Recheck vitamin D  DEXA scan due next year

## 2022-08-02 NOTE — Assessment & Plan Note (Signed)
Well controlled Continue current medications Recheck metabolic panel F/u in 6 months  

## 2022-08-02 NOTE — Assessment & Plan Note (Signed)
Previously well controlled Continue statin Repeat FLP and CMP  

## 2022-08-03 LAB — LIPID PANEL WITH LDL/HDL RATIO
Cholesterol, Total: 185 mg/dL (ref 100–199)
HDL: 40 mg/dL
LDL Chol Calc (NIH): 121 mg/dL — ABNORMAL HIGH (ref 0–99)
LDL/HDL Ratio: 3 ratio (ref 0.0–3.2)
Triglycerides: 136 mg/dL (ref 0–149)
VLDL Cholesterol Cal: 24 mg/dL (ref 5–40)

## 2022-08-03 LAB — COMPREHENSIVE METABOLIC PANEL
ALT: 24 IU/L (ref 0–32)
AST: 23 IU/L (ref 0–40)
Albumin/Globulin Ratio: 2.2 (ref 1.2–2.2)
Albumin: 4.4 g/dL (ref 3.8–4.8)
Alkaline Phosphatase: 58 IU/L (ref 44–121)
BUN/Creatinine Ratio: 19 (ref 12–28)
BUN: 16 mg/dL (ref 8–27)
Bilirubin Total: 0.6 mg/dL (ref 0.0–1.2)
CO2: 26 mmol/L (ref 20–29)
Calcium: 9.5 mg/dL (ref 8.7–10.3)
Chloride: 99 mmol/L (ref 96–106)
Creatinine, Ser: 0.86 mg/dL (ref 0.57–1.00)
Globulin, Total: 2 g/dL (ref 1.5–4.5)
Glucose: 144 mg/dL — ABNORMAL HIGH (ref 70–99)
Potassium: 3.8 mmol/L (ref 3.5–5.2)
Sodium: 139 mmol/L (ref 134–144)
Total Protein: 6.4 g/dL (ref 6.0–8.5)
eGFR: 71 mL/min/{1.73_m2} (ref >59)

## 2022-08-03 LAB — HEMOGLOBIN A1C
Est. average glucose Bld gHb Est-mCnc: 117 mg/dL
Hgb A1c MFr Bld: 5.7 % — ABNORMAL HIGH (ref 4.8–5.6)

## 2022-08-03 LAB — VITAMIN D 25 HYDROXY (VIT D DEFICIENCY, FRACTURES): Vit D, 25-Hydroxy: 52.3 ng/mL (ref 30.0–100.0)

## 2022-10-22 ENCOUNTER — Other Ambulatory Visit: Payer: Self-pay | Admitting: Family Medicine

## 2022-10-22 DIAGNOSIS — K219 Gastro-esophageal reflux disease without esophagitis: Secondary | ICD-10-CM

## 2023-02-11 ENCOUNTER — Ambulatory Visit: Payer: Self-pay

## 2023-02-11 ENCOUNTER — Encounter: Payer: Self-pay | Admitting: Physician Assistant

## 2023-02-11 ENCOUNTER — Ambulatory Visit (INDEPENDENT_AMBULATORY_CARE_PROVIDER_SITE_OTHER): Payer: Medicare Other | Admitting: Physician Assistant

## 2023-02-11 VITALS — BP 160/80 | HR 95 | Temp 97.9°F | Resp 16 | Ht 67.0 in | Wt 210.5 lb

## 2023-02-11 DIAGNOSIS — I10 Essential (primary) hypertension: Secondary | ICD-10-CM

## 2023-02-11 DIAGNOSIS — H1131 Conjunctival hemorrhage, right eye: Secondary | ICD-10-CM

## 2023-02-11 NOTE — Telephone Encounter (Signed)
     Chief Complaint: Had right eye pain this weekend, no pain now. Has broken blood vessel in eye. Symptoms: Above Frequency: This week Pertinent Negatives: Patient denies pain Disposition: [] ED /[] Urgent Care (no appt availability in office) / [x] Appointment(In office/virtual)/ []  [place] [facility]/ [] Home Care/ [] Refused Recommended Disposition /[] [place] [facility]/ []  Follow-up with PCP Additional Notes: Pt. Agrees with appointment.  Reason for Disposition  MODERATE eye pain or discomfort (e.g., interferes with normal activities or awakens from sleep; more than mild)  Answer Assessment - Initial Assessment Questions 1. ONSET: "When did the pain start?" (e.g., minutes, hours, days)     Sunday 2. TIMING: "Does the pain come and go, or has it been constant since it started?" (e.g., constant, intermittent, fleeting)     Comes and goes 3. SEVERITY: "How bad is the pain?"   (Scale 1-10; mild, moderate or severe)   - MILD (1-3): doesn't interfere with normal activities    - MODERATE (4-7): interferes with normal activities or awakens from sleep    - SEVERE (8-10): excruciating pain and patient unable to do normal activities     None 4. LOCATION: "Where does it hurt?"  (e.g., eyelid, eye, cheekbone)     Right eye 5. CAUSE: "What do you think is causing the pain?"     Unsure 6. VISION: "Do you have blurred vision or changes in your vision?"      No 7. EYE DISCHARGE: "Is there any discharge (pus) from the eye(s)?"  If Yes, ask: "What color is it?"      No 8. FEVER: "Do you have a fever?" If Yes, ask: "What is it, how was it measured, and when did it start?"      No 9. OTHER SYMPTOMS: "Do you have any other symptoms?" (e.g., headache, nasal discharge, facial rash)     No 10. PREGNANCY: "Is there any chance you are pregnant?" "When was your last menstrual period?"       No  Protocols used: Eye Pain and Other Symptoms-A-AH

## 2023-02-11 NOTE — Progress Notes (Signed)
Acute Office Visit   Patient: Kara French   DOB: 12/22/1950   72 y.o. Female  MRN: [id number] Visit Date: 02/11/2023  Today's healthcare provider: Oswaldo Conroy Kiahna Banghart, PA-C  Introduced myself to the patient as a Secondary school teacher and provided education on APPs in clinical practice.    Chief Complaint  Patient presents with   Eye Pain    Left eye, Mon & Wed burst    Subjective    HPI HPI     Eye Pain    Additional comments: Left eye, Mon & Wed burst       Last edited by Forde Radon, CMA on 02/11/2023  3:05 PM.       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   cetirizine (ZYRTEC) 10 MG tablet Take 10 mg by mouth daily.   losartan-hydrochlorothiazide (HYZAAR) 100-25 MG tablet TAKE 1 TABLET DAILY   metoprolol succinate (TOPROL-XL) 100 MG 24 hr tablet TAKE 1 TABLET DAILY. TAKE WITH OR IMMEDIATELY FOLLOWING A MEAL   Olopatadine HCl 0.2 % SOLN Apply 1 drop to eye daily. Both eyes   pantoprazole (PROTONIX) 40 MG tablet TAKE 1 TABLET DAILY   pravastatin (PRAVACHOL) 20 MG tablet TAKE 1 TABLET  DAILY   PREBIOTIC PRODUCT PO Take by mouth daily.    No facility-administered medications prior to visit.    Review of Systems  Eyes:  Positive for redness. Negative for pain.  Respiratory:  Positive for shortness of breath. Negative for wheezing.   Cardiovascular:  Negative for chest pain, palpitations and leg swelling.  Musculoskeletal:  Negative for neck pain and neck stiffness.  Neurological:  Positive for headaches. Negative for dizziness and light-headedness.        Objective    BP (!) 168/84   Pulse 95   Temp 97.9 F (36.6 C) (Oral)   Resp 16   Ht 5\' 7"  (1.702 m)   Wt 210 lb 8 oz (95.5 kg)   SpO2 95%   BMI 32.97 kg/m     Physical Exam Vitals reviewed.  Constitutional:      General: She is awake.     Appearance: Normal appearance. She is well-developed and well-groomed.  HENT:     Head: Normocephalic and atraumatic.  Eyes:     General: Lids are normal. Gaze aligned appropriately.        Right eye: No foreign body, discharge or hordeolum.        Left eye: No foreign body, discharge or hordeolum.     Extraocular Movements:  Right eye: Normal extraocular motion and no nystagmus.     Left eye: Normal extraocular motion and no nystagmus.     Conjunctiva/sclera:     Right eye: Hemorrhage present.     Left eye: Left conjunctiva is not injected. No chemosis, exudate or hemorrhage.    Pupils: Pupils are equal, round, and reactive to light.  Cardiovascular:     Rate and Rhythm: Normal rate and regular rhythm.     Pulses: Normal pulses.          Radial pulses are 2+ on the right side and 2+ on the left side.     Heart sounds: Normal heart sounds. No murmur heard.    No friction rub. No gallop.  Pulmonary:     Effort: Pulmonary effort is normal.     Breath sounds: Normal breath sounds. No decreased air movement. No decreased breath sounds, wheezing, rhonchi or rales.  Musculoskeletal:     Right lower leg: No edema.     Left lower leg: No edema.  Neurological:      Mental Status: She is alert.  Psychiatric:        Attention and Perception: Attention and perception normal.        Mood and Affect: Mood and affect normal.        Speech: Speech normal.        Behavior: Behavior normal. Behavior is cooperative.       No results found for any visits on 02/11/23.  Assessment & Plan      No follow-ups on file.       Problem List Items Addressed This Visit       Cardiovascular and Mediastinum   Hypertension    Chronic, appears well managed Suspect she has whitecoat HTN component that is producing today's elevated readings She is taking losartan- hydrochlorothiazide 100-25 mg PO every day and metoprolol 100 mg PO every day and appears to be tolerating well Discussed her home readings which have been mildly elevated above her normal- unsure if this is due to stress from subconjunctival hemorrhage concerns Recommend she continues with current regimen for the next 4 weeks- check BP daily and if not returning to normal levels she should schedule follow up        Other Visit Diagnoses     Subconjunctival hemorrhage of right eye    -  Primary      Acute, new concern PE is overall reassuring at this time Recommend using lubricating eye drops and cool compresses to assist with discomfort She has already reached out to eye doctor We reviewed signs and symptoms requiring follow up and ED evaluation Follow up as needed for persistent or progressing symptoms     No follow-ups on file.   I, Zenya Hickam E Johnna Bollier, PA-C, have reviewed all documentation for this visit. The documentation on 02/11/23 for the exam, diagnosis, procedures, and orders are all accurate and complete.   Jacquelin Hawking, MHS, PA-C [facility]

## 2023-02-11 NOTE — Patient Instructions (Addendum)
You can use Lubricating eye drops and cool compresses to assist with pain and discomfort  If you notice swelling, vision changes, headaches, fever please seek prompt medical attention   Please continue to take your BP at home daily over the next 4 weeks If it continues to stay elevated please make an apt to discuss this further as medications may need to be changed  It was nice to meet you and I appreciate the opportunity to be involved in your care If you were satisfied with the care you received from me, I would greatly appreciate you saying so in the after-visit survey that is sent out following our visit.

## 2023-02-11 NOTE — Assessment & Plan Note (Signed)
Chronic, appears well managed Suspect she has whitecoat HTN component that is producing today's elevated readings She is taking losartan- hydrochlorothiazide 100-25 mg PO every day and metoprolol 100 mg PO every day and appears to be tolerating well Discussed her home readings which have been mildly elevated above her normal- unsure if this is due to stress from subconjunctival hemorrhage concerns Recommend she continues with current regimen for the next 4 weeks- check BP daily and if not returning to normal levels she should schedule follow up

## 2023-04-14 ENCOUNTER — Other Ambulatory Visit: Payer: Self-pay | Admitting: Family Medicine

## 2023-04-15 NOTE — Telephone Encounter (Signed)
Discontinued None Erasmo Downer, MD 01/28/22 1454         Requested Prescriptions  Refused Prescriptions Disp Refills   DULoxetine (CYMBALTA) 20 MG capsule [Pharmacy Med Name: DULOXETINE HCL DR CAPS 20MG ] 90 capsule 3    Sig: TAKE 1 CAPSULE DAILY     Psychiatry: Antidepressants - SNRI - duloxetine Failed - 04/15/2023  3:29 PM      Failed - Last BP in normal range    BP Readings from Last 1 Encounters:  02/11/23 (!) 160/80         Passed - Cr in normal range and within 360 days    Creat  Date Value Ref Range Status  01/13/2017 0.79 0.50 - 0.99 mg/dL Final    Comment:    For patients >44 years of age, the reference limit for Creatinine is approximately 13% higher for people identified as African-American. .    Creatinine, Ser  Date Value Ref Range Status  08/02/2022 0.86 0.57 - 1.00 mg/dL Final         Passed - eGFR is 30 or above and within 360 days    GFR, Est African American  Date Value Ref Range Status  01/13/2017 89 > OR = 60 mL/min/1.38m2 Final   GFR calc Af Amer  Date Value Ref Range Status  02/20/2020 76 >59 mL/min/1.73 Final    Comment:    **In accordance with recommendations from the NKF-ASN Task force,**   [facility] is in the process of updating its eGFR calculation to the   2021 CKD-EPI creatinine equation that estimates kidney function   without a race variable.    GFR, Est Non African American  Date Value Ref Range Status  01/13/2017 77 > OR = 60 mL/min/1.23m2 Final   GFR calc non Af Amer  Date Value Ref Range Status  02/20/2020 66 >59 mL/min/1.73 Final   eGFR  Date Value Ref Range Status  08/02/2022 71 >59 mL/min/1.73 Final         Passed - Completed PHQ-2 or PHQ-9 in the last 360 days      Passed - Valid encounter within last 6 months    Recent Outpatient Visits           2 months ago Subconjunctival hemorrhage of right eye   [facility] [place], Oswaldo Conroy, PA-C   8 months ago Encounter for annual wellness  visit (AWV) in Medicare patient   [facility] [place], Marzella Schlein, MD   1 year ago Mixed hyperlipidemia   [place] [facility] [place], Marzella Schlein, MD   1 year ago Encounter for annual wellness visit (AWV) in Medicare patient   [facility] [place], Marzella Schlein, MD   2 years ago Primary hypertension   [facility] [place], Marzella Schlein, MD

## 2023-05-04 ENCOUNTER — Encounter: Payer: Self-pay | Admitting: Family Medicine

## 2023-05-12 ENCOUNTER — Telehealth: Payer: Medicare Other | Admitting: Family Medicine

## 2023-05-12 ENCOUNTER — Encounter: Payer: Self-pay | Admitting: Family Medicine

## 2023-05-12 DIAGNOSIS — M797 Fibromyalgia: Secondary | ICD-10-CM | POA: Diagnosis not present

## 2023-05-12 MED ORDER — DULOXETINE HCL 20 MG PO CPEP: 20.0000 mg | ORAL_CAPSULE | Freq: Every day | ORAL | 1 refills | Status: DC

## 2023-05-12 NOTE — Assessment & Plan Note (Signed)
Chronic condition with widespread musculoskeletal pain, morning stiffness, and sleep disturbances. Reports increased morning stiffness and hip pain, interfering with sleep. Symptoms worsened after discontinuing duloxetine, which was previously effective. Discussed duloxetine's full efficacy timeline (up to six weeks) and potential side effects (upset stomach, typically improving with continued use). Benefits include pain relief and potential mood and anxiety improvement. - Prescribe duloxetine 20 mg daily, 90-day supply via [facility] - Follow-up to reassess efficacy and side effects at physical exam in April

## 2023-05-12 NOTE — Progress Notes (Signed)
MyChart Video Visit    Virtual Visit via Video Note   This format is felt to be most appropriate for this patient at this time. Physical exam was limited by quality of the video and audio technology used for the visit.    Patient location: home Provider location: [facility] Persons involved in the visit: patient, provider  I discussed the limitations of evaluation and management by telemedicine and the availability of in person appointments. The patient expressed understanding and agreed to proceed.  Patient: Kara French   DOB: 1950/07/01   73 y.o. Female  MRN: [id number] Visit Date: 05/12/2023  Today's healthcare provider: Shirlee Latch, MD   No chief complaint on file.  Subjective    HPI   Discussed the use of AI scribe software for clinical note transcription with the patient, who gave verbal consent to proceed.  History of Present Illness   The patient, with a history of fibromyalgia, presents with increased morning stiffness and hip pain that interferes with sleep. They report that these symptoms have returned after discontinuing duloxetine (Cymbalta) 20mg  daily. The patient had previously found relief from these symptoms while on duloxetine and while adhering to a diet that eliminated sugar and wheat. However, they have since reintroduced sugar into their diet and have noticed a return of their fibromyalgia symptoms.      Review of Systems      Objective    There were no vitals taken for this visit.      Physical Exam Constitutional:      General: She is not in acute distress.    Appearance: Normal appearance.  HENT:     Head: Normocephalic.  Pulmonary:     Effort: Pulmonary effort is normal. No respiratory distress.  Neurological:     Mental Status: She is alert and oriented to person, place, and time. Mental status is at baseline.        Assessment & Plan     Problem List Items Addressed This Visit       Other    Fibromyalgia - Primary   Chronic condition with widespread musculoskeletal pain, morning stiffness, and sleep disturbances. Reports increased morning stiffness and hip pain, interfering with sleep. Symptoms worsened after discontinuing duloxetine, which was previously effective. Discussed duloxetine's full efficacy timeline (up to six weeks) and potential side effects (upset stomach, typically improving with continued use). Benefits include pain relief and potential mood and anxiety improvement. - Prescribe duloxetine 20 mg daily, 90-day supply via [facility] - Follow-up to reassess efficacy and side effects at physical exam in April      Relevant Medications   DULoxetine (CYMBALTA) 20 MG capsule       General Health Maintenance Annual physical exam due in April. - Schedule physical exam for April 22 at 2:20 PM.         Meds ordered this encounter  Medications   DULoxetine (CYMBALTA) 20 MG capsule    Sig: Take 1 capsule (20 mg total) by mouth daily.    Dispense:  90 capsule    Refill:  1     Return in about 3 months (around 08/10/2023) for CPE.     I discussed the assessment and treatment plan with the patient. The patient was provided an opportunity to ask questions and all were answered. The patient agreed with the plan and demonstrated an understanding of the instructions.   The patient was advised to call back or seek an in-person evaluation if  the symptoms worsen or if the condition fails to improve as anticipated.   Shirlee Latch, MD [facility] [phone] (phone) [phone] (fax)  [facility]

## 2023-06-29 ENCOUNTER — Ambulatory Visit: Payer: Medicare Other

## 2023-06-29 VITALS — Ht 67.0 in | Wt 210.0 lb

## 2023-06-29 DIAGNOSIS — Z780 Asymptomatic menopausal state: Secondary | ICD-10-CM

## 2023-06-29 DIAGNOSIS — Z0001 Encounter for general adult medical examination with abnormal findings: Secondary | ICD-10-CM | POA: Diagnosis not present

## 2023-06-29 DIAGNOSIS — M810 Age-related osteoporosis without current pathological fracture: Secondary | ICD-10-CM

## 2023-06-29 DIAGNOSIS — Z0000 Encounter for general adult medical examination without abnormal findings: Secondary | ICD-10-CM

## 2023-06-29 NOTE — Progress Notes (Signed)
 Subjective:   Kara French is a 73 y.o. who presents for a Medicare Wellness preventive visit.  Visit Complete: Virtual I connected with  Kara French on 06/29/23 by a video and audio enabled telemedicine application and verified that I am speaking with the correct person using two identifiers.  Patient Location: Home  Provider Location: Home Office  I discussed the limitations of evaluation and management by telemedicine. The patient expressed understanding and agreed to proceed.  Vital Signs: Because this visit was a virtual/telehealth visit, some criteria may be missing or patient reported. Any vitals not documented were not able to be obtained and vitals that have been documented are patient reported.    AWV Questionnaire: Yes: Patient Medicare AWV questionnaire was completed by the patient on 06/28/23; I have confirmed that all information answered by patient is correct and no changes since this date.  Cardiac Risk Factors include: advanced age (>24men, >76 women);hypertension;dyslipidemia;obesity (BMI >30kg/m2)     Objective:    Today's Vitals   06/29/23 1129  Weight: 210 lb (95.3 kg)  Height: 5\' 7"  (1.702 m)   Body mass index is 32.89 kg/m.     06/29/2023   11:41 AM 10/30/2019    7:54 AM 08/09/2019    9:04 AM 07/18/2018   10:10 AM 06/06/2017   11:21 AM 01/13/2017    2:10 PM 06/13/2016    8:24 AM  Advanced Directives  Does Patient Have a Medical Advance Directive? Yes No No No No No No  Type of Estate agent of [place];Living will        Does patient want to make changes to medical advance directive? No - Patient declined        Copy of Healthcare Power of Attorney in Chart? No - copy requested        Would patient like information on creating a medical advance directive?  No - Patient declined No - Patient declined No - Patient declined No - Patient declined  No - Patient declined    Current Medications (verified) Outpatient Encounter  Medications as of 06/29/2023  Medication Sig   ASPIRIN 81 PO Take by mouth.   Cholecalciferol (VITAMIN D3) 125 MCG (5000 UT) TABS Take 5,000 Units by mouth every other day.   DULoxetine (CYMBALTA) 20 MG capsule Take 1 capsule (20 mg total) by mouth daily.   loratadine (CLARITIN) 10 MG tablet Take 10 mg by mouth daily.   losartan-hydrochlorothiazide (HYZAAR) 100-25 MG tablet TAKE 1 TABLET DAILY   metoprolol succinate (TOPROL-XL) 100 MG 24 hr tablet TAKE 1 TABLET DAILY. TAKE WITH OR IMMEDIATELY FOLLOWING A MEAL   Olopatadine HCl 0.2 % SOLN Apply 1 drop to eye daily. Both eyes   pantoprazole (PROTONIX) 40 MG tablet TAKE 1 TABLET DAILY   pravastatin (PRAVACHOL) 20 MG tablet TAKE 1 TABLET DAILY   PREBIOTIC PRODUCT PO Take by mouth daily.    cetirizine (ZYRTEC) 10 MG tablet Take 10 mg by mouth daily. (Patient not taking: Reported on 06/29/2023)   No facility-administered encounter medications on file as of 06/29/2023.    Allergies (verified) Aller-chlor  [chlorpheniramine], Benzalkonium chloride, Neomycin-bacitracin zn-polymyx, Tape, Decongestant [pseudoephedrine hcl er], and Iodinated contrast media   History: Past Medical History:  Diagnosis Date   Allergic rhinitis    Allergy    Anemia    Childhood   Anxiety    Arthritis    Barrett's esophagus 2017   biopsy confirmed, related to GERD   Cataract 2021  GERD (gastroesophageal reflux disease)    Heart murmur 1990   Positional murmur   Hyperlipidemia    Hypertension    Mitral valve prolapse    followed by cardiology   Osteoporosis 2022   Precancerous skin lesion    Past Surgical History:  Procedure Laterality Date   BARTHOLIN GLAND CYST EXCISION  1975   BREAST ENHANCEMENT SURGERY Bilateral 1975   COSMETIC SURGERY  1975   Breast augmentation   ESOPHAGOGASTRODUODENOSCOPY (EGD) WITH PROPOFOL N/A 10/30/2019   Procedure: ESOPHAGOGASTRODUODENOSCOPY (EGD) WITH PROPOFOL;  Surgeon: Midge Minium, MD;  Location: [facility] ENDOSCOPY;  Service:  Endoscopy;  Laterality: N/A;   EYE SURGERY Left 02/28/2019   cataract surgery   EYE SURGERY Right 03/28/2019   cataract surgery   LAPAROSCOPIC CHOLECYSTECTOMY  2011   LAPAROSCOPIC OOPHERECTOMY Right 1987   2/2 benign tumor   TRANSANAL EXCISION OF RECTAL MASS WITH HEMORRHOID INJECTION  1989   hemorrhoid removal   TUBAL LIGATION  1987   Family History  Problem Relation Age of Onset   Dementia Mother 19   Heart disease Mother 19       s/p 7 stents and CABG x3   Arthritis Mother    Hyperlipidemia Mother    Hypertension Mother    Colon cancer Sister 16   Cancer Sister    Heart disease Brother        s./p stent placement   Heart disease Brother        s/p stent placement   Congestive Heart Failure Maternal Grandmother 82   Heart attack Maternal Grandfather 85   Healthy Paternal Grandmother    Breast cancer Paternal Aunt 59   Social History   Socioeconomic History   Marital status: Widowed    Spouse name: Not on file   Number of children: 1   Years of education: post-graduate   Highest education level: Master's degree (e.g., MA, MS, MEng, MEd, MSW, MBA)  Occupational History   Occupation: retired    Comment: Human resources officer  Tobacco Use   Smoking status: Former    Current packs/day: 0.00    Average packs/day: 0.5 packs/day for 15.0 years (7.5 ttl pk-yrs)    Types: Cigarettes    Start date: 04/18/1982    Quit date: 04/18/1997    Years since quitting: 26.2   Smokeless tobacco: Never  Vaping Use   Vaping status: Never Used  Substance and Sexual Activity   Alcohol use: No   Drug use: No   Sexual activity: Not Currently  Other Topics Concern   Not on file  Social History Narrative   Not on file   Social Drivers of Health   Financial Resource Strain: Low Risk  (06/29/2023)   Overall Financial Resource Strain (CARDIA)    Difficulty of Paying Living Expenses: Not hard at all  Food Insecurity: No Food Insecurity (06/29/2023)   Hunger Vital Sign    Worried About  Running Out of Food in the Last Year: Never true    Ran Out of Food in the Last Year: Never true  Transportation Needs: No Transportation Needs (06/29/2023)   PRAPARE - Administrator, Civil Service (Medical): No    Lack of Transportation (Non-Medical): No  Physical Activity: Inactive (06/29/2023)   Exercise Vital Sign    Days of Exercise per Week: 0 days    Minutes of Exercise per Session: 0 min  Stress: No Stress Concern Present (06/29/2023)   [employer] of Occupational Health - Occupational Stress Questionnaire  Feeling of Stress : Not at all  Social Connections: Moderately Isolated (06/29/2023)   Social Connection and Isolation Panel [NHANES]    Frequency of Communication with Friends and Family: More than three times a week    Frequency of Social Gatherings with Friends and Family: More than three times a week    Attends Religious Services: 1 to 4 times per year    Active Member of [employer] or Organizations: No    Attends Banker Meetings: Never    Marital Status: Widowed    Tobacco Counseling Counseling given: Not Answered    Clinical Intake:  Pre-visit preparation completed: Yes  Pain : No/denies pain     BMI - recorded: 32.89 Nutritional Status: BMI > 30  Obese Nutritional Risks: None Diabetes: No  How often do you need to have someone help you when you read instructions, pamphlets, or other written materials from your doctor or pharmacy?: 1 - Never  Interpreter Needed?: No  Information entered by :: Tora Kindred, CMA   Activities of Daily Living     06/29/2023   11:31 AM 06/28/2023    1:15 PM  In your present state of health, do you have any difficulty performing the following activities:  Hearing? 0 0  Vision? 0 0  Difficulty concentrating or making decisions? 0 0  Walking or climbing stairs? 0 0  Dressing or bathing? 0 0  Doing errands, shopping? 0 0  Preparing Food and eating ? N N  Using the Toilet? N N  In the past  six months, have you accidently leaked urine? N N  Do you have problems with loss of bowel control? N N  Managing your Medications? N N  Managing your Finances? N N  Housekeeping or managing your Housekeeping? N N    Patient Care Team: Erasmo Downer, MD as PCP - General (Family Medicine) Pa, [facility] (Dermatology) Carrie Mew, OD (Optometry)  Indicate any recent Medical Services you may have received from other than [facility] providers in the past year (date may be approximate).     Assessment:   This is a routine wellness examination for Kara French.  Hearing/Vision screen Hearing Screening - Comments:: Denies hearing loss Vision Screening - Comments:: Gets eye exams, Dr. Lynnae Prude [facility]   Goals Addressed             This Visit's Progress    Patient Stated       Be more active       Depression Screen     06/29/2023   11:38 AM 02/11/2023    3:01 PM 08/02/2022    1:59 PM 07/14/2021   10:41 AM 08/26/2020   10:10 AM 02/18/2020    3:26 PM 08/09/2019    9:00 AM  PHQ 2/9 Scores  PHQ - 2 Score 0 0 0 0 0 0 0  PHQ- 9 Score 0 0 0 0 2 3     Fall Risk     06/29/2023   11:42 AM 06/28/2023    1:15 PM 02/11/2023    3:01 PM 08/02/2022    1:59 PM 07/29/2022   11:43 AM  Fall Risk   Falls in the past year? 0 0 0 0 0  Number falls in past yr: 0 0 0 0   Injury with Fall? 0 0 0 0   Risk for fall due to : No Fall Risks  No Fall Risks No Fall Risks   Follow up Falls prevention discussed;Falls evaluation  completed  Falls prevention discussed;Education provided;Falls evaluation completed Falls evaluation completed     MEDICARE RISK AT HOME:  Medicare Risk at Home Any stairs in or around the home?: No If so, are there any without handrails?: No Home free of loose throw rugs in walkways, pet beds, electrical cords, etc?: Yes Adequate lighting in your home to reduce risk of falls?: Yes Life alert?: Yes Use of a cane, walker or w/c?: No Grab bars in the  bathroom?: No Shower chair or bench in shower?: Yes Elevated toilet seat or a handicapped toilet?: Yes  TIMED UP AND GO:  Was the test performed?  No  Cognitive Function: 6CIT completed        06/29/2023   11:42 AM 07/14/2021   10:52 AM 02/18/2020    3:30 PM  6CIT Screen  What Year? 0 points 0 points 0 points  What month? 0 points 0 points 0 points  What time? 0 points 0 points 0 points  Count back from 20 0 points 0 points 0 points  Months in reverse 0 points 2 points 0 points  Repeat phrase 0 points 2 points 0 points  Total Score 0 points 4 points 0 points    Immunizations Immunization History  Administered Date(s) Administered   Fluad Quad(high Dose 65+) 02/15/2019, 02/18/2020, 02/18/2021, 01/28/2022   Influenza, High Dose Seasonal PF 01/13/2017, 02/14/2018   PFIZER(Purple Top)SARS-COV-2 Vaccination 05/29/2019, 06/19/2019, 01/25/2020   Pfizer Covid-19 Vaccine Bivalent Booster 36yrs & up 09/19/2020, 02/26/2021   Pneumococcal Conjugate-13 06/07/2014   Pneumococcal Polysaccharide-23 06/23/2015   Tdap 03/17/2016    Screening Tests Health Maintenance  Topic Date Due   Zoster Vaccines- Shingrix (1 of 2) Never done   MAMMOGRAM  10/30/2016   Colonoscopy  09/04/2020   INFLUENZA VACCINE  11/18/2022   COVID-19 Vaccine (6 - 2024-25 season) 12/19/2022   DEXA SCAN  09/11/2023   Medicare Annual Wellness (AWV)  06/28/2024   DTaP/Tdap/Td (2 - Td or Tdap) 03/17/2026   Pneumonia Vaccine 66+ Years old  Completed   Hepatitis C Screening  Completed   HPV VACCINES  Aged Out    Health Maintenance  Health Maintenance Due  Topic Date Due   Zoster Vaccines- Shingrix (1 of 2) Never done   MAMMOGRAM  10/30/2016   Colonoscopy  09/04/2020   INFLUENZA VACCINE  11/18/2022   COVID-19 Vaccine (6 - 2024-25 season) 12/19/2022   Health Maintenance Items Addressed: See Nurse Notes  Additional Screening:  Vision Screening: Recommended annual ophthalmology exams for early detection of  glaucoma and other disorders of the eye.  Dental Screening: Recommended annual dental exams for proper oral hygiene  Community Resource Referral / Chronic Care Management: CRR required this visit?  No   CCM required this visit?  No     Plan:     I have personally reviewed and noted the following in the patient's chart:   Medical and social history Use of alcohol, tobacco or illicit drugs  Current medications and supplements including opioid prescriptions. Patient is not currently taking opioid prescriptions. Functional ability and status Nutritional status Physical activity Advanced directives List of other physicians Hospitalizations, surgeries, and ER visits in previous 12 months Vitals Screenings to include cognitive, depression, and falls Referrals and appointments  In addition, I have reviewed and discussed with patient certain preventive protocols, quality metrics, and best practice recommendations. A written personalized care plan for preventive services as well as general preventive health recommendations were provided to patient.     Gunnar Fusi  Maisie Fus, [facility]   06/29/2023   After Visit Summary: (MyChart) Due to this being a telephonic visit, the after visit summary with patients personalized plan was offered to patient via MyChart   Notes:  Patient declined MMG Past due for colonoscopy, declined referral Placed order for a DEXA scan (due 09/11/23) Will bring record of shingles and covid vaccines at the next OV.

## 2023-06-29 NOTE — Patient Instructions (Addendum)
 [name] , Thank you for taking time to come for your Medicare Wellness Visit. I appreciate your ongoing commitment to your health goals. Please review the following plan we discussed and let me know if I can assist you in the future.   Referrals/Orders/Follow-Ups/Clinician Recommendations: I have placed an order for a bone density test (due 09/11/23). Call [facility] @ [phone] to schedule at your convenience. You are past due for a colonoscopy. Bring record of your covid and shingles vaccines to your next OV.  This is a list of the screening recommended for you and due dates:  Health Maintenance  Topic Date Due   Zoster (Shingles) Vaccine (1 of 2) Never done   Mammogram  10/30/2016   Colon Cancer Screening  09/04/2020   COVID-19 Vaccine (6 - 2024-25 season) 12/19/2022   DEXA scan (bone density measurement)  09/11/2023   Medicare Annual Wellness Visit  06/28/2024   DTaP/Tdap/Td vaccine (2 - Td or Tdap) 03/17/2026   Pneumonia Vaccine  Completed   Flu Shot  Completed   Hepatitis C Screening  Completed   HPV Vaccine  Aged Out    Advanced directives: (Copy Requested) Please bring a copy of your health care power of attorney and living will to the office to be added to your chart at your convenience. You can mail to [facility] [address] [place], [place] [address] or email to [email or web address]  Next Medicare Annual Wellness Visit scheduled for next year: Yes, 07/04/24 @ 10:50am (video visit)

## 2023-07-06 ENCOUNTER — Other Ambulatory Visit (HOSPITAL_COMMUNITY)
Admission: RE | Admit: 2023-07-06 | Discharge: 2023-07-06 | Disposition: A | Discharge status: Home / Self Care | Source: Ambulatory Visit | Attending: Family Medicine | Admitting: Family Medicine

## 2023-07-06 ENCOUNTER — Encounter: Payer: Self-pay | Admitting: Family Medicine

## 2023-07-06 ENCOUNTER — Ambulatory Visit: Admitting: Family Medicine

## 2023-07-06 VITALS — BP 126/72 | HR 91 | Resp 16 | Ht 67.0 in | Wt 215.0 lb

## 2023-07-06 DIAGNOSIS — N898 Other specified noninflammatory disorders of vagina: Secondary | ICD-10-CM | POA: Insufficient documentation

## 2023-07-06 DIAGNOSIS — B379 Candidiasis, unspecified: Secondary | ICD-10-CM

## 2023-07-06 DIAGNOSIS — T3695XA Adverse effect of unspecified systemic antibiotic, initial encounter: Secondary | ICD-10-CM

## 2023-07-06 MED ORDER — FLUCONAZOLE 150 MG PO TABS: 150.0000 mg | ORAL_TABLET | ORAL | 1 refills | Status: AC | PRN

## 2023-07-06 NOTE — Patient Instructions (Signed)
 Try diflucan and gentle bathing/rinsing like a sitz bath You can try external powders as well miconazole/ketoconazole but avoid any steroids  I sent in 4 doses of diflucan  I would recommend a follow up appointment if you do not have symptoms resolve.

## 2023-07-06 NOTE — Progress Notes (Signed)
 Patient ID: Kara French, female    DOB: [birth year], 73 y.o.   MRN: [id number]  PCP: Erasmo Downer, MD  Chief Complaint  Patient presents with   Vaginal Itching    Subjective:   Kara French is a 73 y.o. female, presents to clinic with CC of the following:  HPI  Pt has been on antibiotics for an eye infection and she developed vaginal yeast infection sx, failed otc tx, she called the eye doctor and he was unable to treat the secondary yeast infection, here for eval   Patient Active Problem List   Diagnosis Date Noted   Adhesive capsulitis of left shoulder 01/28/2022   Fibromyalgia 02/18/2020   Obesity (BMI 30-39.9) 08/16/2019   Extrinsic asthma 02/15/2019   Osteoporosis 02/15/2019   IBS (irritable bowel syndrome) 05/11/2017   Hypertension    Hyperlipidemia    Allergic rhinitis    GERD (gastroesophageal reflux disease)    Mitral valve prolapse       Current Outpatient Medications:    ASPIRIN 81 PO, Take by mouth., Disp: , Rfl:    Cholecalciferol (VITAMIN D3) 125 MCG (5000 UT) TABS, Take 5,000 Units by mouth every other day., Disp: , Rfl:    DULoxetine (CYMBALTA) 20 MG capsule, Take 1 capsule (20 mg total) by mouth daily., Disp: 90 capsule, Rfl: 1   loratadine (CLARITIN) 10 MG tablet, Take 10 mg by mouth daily., Disp: , Rfl:    losartan-hydrochlorothiazide (HYZAAR) 100-25 MG tablet, TAKE 1 TABLET DAILY, Disp: 90 tablet, Rfl: 3   metoprolol succinate (TOPROL-XL) 100 MG 24 hr tablet, TAKE 1 TABLET DAILY. TAKE WITH OR IMMEDIATELY FOLLOWING A MEAL, Disp: 90 tablet, Rfl: 3   Olopatadine HCl 0.2 % SOLN, Apply 1 drop to eye daily. Both eyes, Disp: , Rfl:    pantoprazole (PROTONIX) 40 MG tablet, TAKE 1 TABLET DAILY, Disp: 90 tablet, Rfl: 3   pravastatin (PRAVACHOL) 20 MG tablet, TAKE 1 TABLET DAILY, Disp: 90 tablet, Rfl: 3   PREBIOTIC PRODUCT PO, Take by mouth daily. , Disp: , Rfl:    cetirizine (ZYRTEC) 10 MG tablet, Take 10 mg by mouth daily. (Patient not  taking: Reported on 07/06/2023), Disp: , Rfl:    Allergies  Allergen Reactions   Aller-Chlor  [Chlorpheniramine]    Benzalkonium Chloride Other (See Comments)    BLISTERS   Neomycin-Bacitracin Zn-Polymyx    Tape    Decongestant [Pseudoephedrine Hcl Er] Itching   Iodinated Contrast Media Rash and Hives     Social History   Tobacco Use   Smoking status: Former    Current packs/day: 0.00    Average packs/day: 0.5 packs/day for 15.0 years (7.5 ttl pk-yrs)    Types: Cigarettes    Start date: 04/18/1982    Quit date: 04/18/1997    Years since quitting: 26.2   Smokeless tobacco: Never  Vaping Use   Vaping status: Never Used  Substance Use Topics   Alcohol use: No   Drug use: No      Chart Review Today: I personally reviewed active problem list, medication list, allergies, family history, social history, health maintenance, notes from last encounter, lab results, imaging with the patient/caregiver today.   Review of Systems  Constitutional: Negative.   HENT: Negative.    Eyes: Negative.   Respiratory: Negative.    Cardiovascular: Negative.   Gastrointestinal: Negative.   Endocrine: Negative.   Genitourinary: Negative.   Musculoskeletal: Negative.   Skin: Negative.   Allergic/Immunologic: Negative.  Neurological: Negative.   Hematological: Negative.   Psychiatric/Behavioral: Negative.    All other systems reviewed and are negative.      Objective:   Vitals:   07/06/23 1059  BP: 126/72  Pulse: 91  Resp: 16  SpO2: 99%  Weight: 215 lb (97.5 kg)  Height: 5\' 7"  (1.702 m)    Body mass index is 33.67 kg/m.  Physical Exam Vitals and nursing note reviewed.  Constitutional:      General: She is not in acute distress.    Appearance: Normal appearance. She is well-developed. She is not ill-appearing, toxic-appearing or diaphoretic.  HENT:     Head: Normocephalic and atraumatic.     Nose: Nose normal.  Eyes:     General:        Right eye: No discharge.         Left eye: No discharge.     Conjunctiva/sclera: Conjunctivae normal.  Neck:     Trachea: No tracheal deviation.  Cardiovascular:     Rate and Rhythm: Normal rate.  Pulmonary:     Effort: Pulmonary effort is normal. No respiratory distress.     Breath sounds: No stridor.  Skin:    General: Skin is warm and dry.  Neurological:     Mental Status: She is alert.     Motor: No abnormal muscle tone.     Gait: Gait normal.  Psychiatric:        Mood and Affect: Mood normal.        Behavior: Behavior normal.      Results for orders placed or performed in visit on 08/02/22  Comprehensive metabolic panel   Collection Time: 08/02/22  2:26 PM  Result Value Ref Range   Glucose 144 (H) 70 - 99 mg/dL   BUN 16 8 - 27 mg/dL   Creatinine, Ser 2.95 0.57 - 1.00 mg/dL   eGFR 71 >62 ZH/YQM/5.78   BUN/Creatinine Ratio 19 12 - 28   Sodium 139 134 - 144 mmol/L   Potassium 3.8 3.5 - 5.2 mmol/L   Chloride 99 96 - 106 mmol/L   CO2 26 20 - 29 mmol/L   Calcium 9.5 8.7 - 10.3 mg/dL   Total Protein 6.4 6.0 - 8.5 g/dL   Albumin 4.4 3.8 - 4.8 g/dL   Globulin, Total 2.0 1.5 - 4.5 g/dL   Albumin/Globulin Ratio 2.2 1.2 - 2.2   Bilirubin Total 0.6 0.0 - 1.2 mg/dL   Alkaline Phosphatase 58 44 - 121 IU/L   AST 23 0 - 40 IU/L   ALT 24 0 - 32 IU/L  Lipid Panel With LDL/HDL Ratio   Collection Time: 08/02/22  2:26 PM  Result Value Ref Range   Cholesterol, Total 185 100 - 199 mg/dL   Triglycerides 469 0 - 149 mg/dL   HDL 40 >62 mg/dL   VLDL Cholesterol Cal 24 5 - 40 mg/dL   LDL Chol Calc (NIH) 952 (H) 0 - 99 mg/dL   LDL/HDL Ratio 3.0 0.0 - 3.2 ratio  VITAMIN D 25 Hydroxy (Vit-D Deficiency, Fractures)   Collection Time: 08/02/22  2:26 PM  Result Value Ref Range   Vit D, 25-Hydroxy 52.3 30.0 - 100.0 ng/mL  Hemoglobin A1c   Collection Time: 08/02/22  2:26 PM  Result Value Ref Range   Hgb A1c MFr Bld 5.7 (H) 4.8 - 5.6 %   Est. average glucose Bld gHb Est-mCnc 117 mg/dL       Assessment & Plan:      ICD-10-CM  1. Antibiotic-induced yeast infection  B37.9 fluconazole (DIFLUCAN) 150 MG tablet   T36.95XA    secondary to augmentin Rx in the last 2 weeks, failed otc tx, tx with diflucan    2. Vaginal itching  N89.8 Cervicovaginal ancillary only   swab to confirm          Danelle Berry, PA-C 07/06/23 11:19 AM

## 2023-07-07 ENCOUNTER — Encounter: Payer: Self-pay | Admitting: Family Medicine

## 2023-07-07 LAB — CERVICOVAGINAL ANCILLARY ONLY
Bacterial Vaginitis (gardnerella): NEGATIVE
Candida Glabrata: NEGATIVE
Candida Vaginitis: POSITIVE — AB
Chlamydia: NEGATIVE
Comment: NEGATIVE
Comment: NEGATIVE
Comment: NEGATIVE
Comment: NEGATIVE
Comment: NEGATIVE
Comment: NORMAL
Neisseria Gonorrhea: NEGATIVE
Trichomonas: NEGATIVE

## 2023-08-09 ENCOUNTER — Encounter: Payer: Self-pay | Admitting: Family Medicine

## 2023-08-09 ENCOUNTER — Ambulatory Visit (INDEPENDENT_AMBULATORY_CARE_PROVIDER_SITE_OTHER): Payer: Self-pay | Admitting: Family Medicine

## 2023-08-09 VITALS — BP 138/82 | HR 72 | Ht 66.5 in | Wt 215.5 lb

## 2023-08-09 DIAGNOSIS — M810 Age-related osteoporosis without current pathological fracture: Secondary | ICD-10-CM | POA: Diagnosis not present

## 2023-08-09 DIAGNOSIS — H04303 Unspecified dacryocystitis of bilateral lacrimal passages: Secondary | ICD-10-CM

## 2023-08-09 DIAGNOSIS — M797 Fibromyalgia: Secondary | ICD-10-CM

## 2023-08-09 DIAGNOSIS — Z0000 Encounter for general adult medical examination without abnormal findings: Secondary | ICD-10-CM

## 2023-08-09 DIAGNOSIS — E669 Obesity, unspecified: Secondary | ICD-10-CM | POA: Diagnosis not present

## 2023-08-09 DIAGNOSIS — I10 Essential (primary) hypertension: Secondary | ICD-10-CM

## 2023-08-09 DIAGNOSIS — E782 Mixed hyperlipidemia: Secondary | ICD-10-CM

## 2023-08-09 DIAGNOSIS — R739 Hyperglycemia, unspecified: Secondary | ICD-10-CM

## 2023-08-09 DIAGNOSIS — Z1211 Encounter for screening for malignant neoplasm of colon: Secondary | ICD-10-CM | POA: Diagnosis not present

## 2023-08-09 MED ORDER — DOXYCYCLINE HYCLATE 100 MG PO TABS: 100.0000 mg | ORAL_TABLET | Freq: Two times a day (BID) | ORAL | 0 refills | Status: AC

## 2023-08-09 NOTE — Assessment & Plan Note (Signed)
 Blood pressure initially elevated at 168/69, improved to 138/86 upon recheck. Reports elevated readings with automated cuffs, prefers manual measurement. - Recheck blood pressure manually before leaving - Follow-up in 6 months for blood pressure management

## 2023-08-09 NOTE — Assessment & Plan Note (Signed)
 Previously well controlled Continue statin Repeat FLP and CMP

## 2023-08-09 NOTE — Assessment & Plan Note (Signed)
 Discussed importance of healthy weight management Discussed diet and exercise

## 2023-08-09 NOTE — Assessment & Plan Note (Signed)
 Reports improvement iwht cymbalta   Continue

## 2023-08-09 NOTE — Progress Notes (Signed)
 Complete physical exam   Patient: Kara French   DOB: December 03, 1950   73 y.o. Female  MRN: [id number] Visit Date: 08/09/2023  Today's healthcare provider: Aden Agreste, MD   Chief Complaint  Patient presents with   Annual Exam    Last completed 08/02/22; AWV completed 06/29/23 Diet -  General, well balanced Exercise - trying to walk at least 2 miles a day to total 14 miles a week Feeling - well except for eyes Sleeping - well Concerns -  eyes, still wiping green stuff from eyes after treatment for eye infection   Subjective    Kara French is a 73 y.o. female who presents today for a complete physical exam.   Discussed the use of AI scribe software for clinical note transcription with the patient, who gave verbal consent to proceed.  History of Present Illness   The patient, a 73 year old with a history of hypertension, GERD, osteoporosis, hyperlipidemia, and fibromyalgia, presents for her annual physical. The patient's chief complaint is a persistent eye infection with green discharge, which has been ongoing for approximately six weeks. Despite treatment with amoxicillin  and chloramphenicol, the infection has not completely resolved. The patient reports that the infection initially improved with the antibiotics but did not completely clear, leading to a recurrence. The patient also has a long-standing history of dry eyes and allergies, which she manages with various eye drops and lid hygiene measures.  In addition to the eye infection, the patient mentions a history of high blood pressure. She reports that her blood pressure readings have been variable, with some readings being higher than others. The patient also mentions that she is due for a bone density test, which has been ordered but not yet scheduled.  The patient's medication regimen includes baby aspirin, Zyrtec 10mg  daily, Cymbalta  20mg  daily, losartan -HCTZ 100-25mg  daily, metoprolol  XL 100mg  daily, Protonix  40mg   daily, and pravastatin  20mg  daily.        Last depression screening scores    06/29/2023   11:38 AM 02/11/2023    3:01 PM 08/02/2022    1:59 PM  PHQ 2/9 Scores  PHQ - 2 Score 0 0 0  PHQ- 9 Score 0 0 0   Last fall risk screening    06/29/2023   11:42 AM  Fall Risk   Falls in the past year? 0  Number falls in past yr: 0  Injury with Fall? 0  Risk for fall due to : No Fall Risks  Follow up Falls prevention discussed;Falls evaluation completed        Medications: Outpatient Medications Prior to Visit  Medication Sig   ASPIRIN 81 PO Take by mouth.   cetirizine (ZYRTEC) 10 MG tablet Take 10 mg by mouth daily.   Cholecalciferol (VITAMIN D3) 125 MCG (5000 UT) TABS Take 5,000 Units by mouth every other day.   DULoxetine  (CYMBALTA ) 20 MG capsule Take 1 capsule (20 mg total) by mouth daily.   fluconazole  (DIFLUCAN ) 150 MG tablet Take 1 tablet (150 mg total) by mouth every 3 (three) days as needed (for vaginal itching/yeast infection sx).   loratadine (CLARITIN) 10 MG tablet Take 10 mg by mouth daily.   losartan -hydrochlorothiazide (HYZAAR) 100-25 MG tablet TAKE 1 TABLET DAILY   metoprolol  succinate (TOPROL -XL) 100 MG 24 hr tablet TAKE 1 TABLET DAILY. TAKE WITH OR IMMEDIATELY FOLLOWING A MEAL   Olopatadine HCl 0.2 % SOLN Apply 1 drop to eye daily. Both eyes   pantoprazole  (PROTONIX ) 40 MG tablet TAKE  1 TABLET DAILY   pravastatin  (PRAVACHOL ) 20 MG tablet TAKE 1 TABLET DAILY   PREBIOTIC PRODUCT PO Take by mouth daily.    No facility-administered medications prior to visit.    Review of Systems    Objective    BP 138/82 (BP Location: Left Arm, Patient Position: Sitting, Cuff Size: Large)   Pulse 72   Ht 5' 6.5" (1.689 m)   Wt 215 lb 8 oz (97.8 kg)   SpO2 98%   BMI 34.26 kg/m    Physical Exam Vitals reviewed.  Constitutional:      General: She is not in acute distress.    Appearance: Normal appearance. She is well-developed. She is not diaphoretic.  HENT:     Head:  Normocephalic and atraumatic.     Right Ear: Tympanic membrane, ear canal and external ear normal.     Left Ear: Tympanic membrane, ear canal and external ear normal.     Nose: Nose normal.     Mouth/Throat:     Mouth: Mucous membranes are moist.     Pharynx: Oropharynx is clear. No oropharyngeal exudate.  Eyes:     General: No scleral icterus.    Conjunctiva/sclera: Conjunctivae normal.     Pupils: Pupils are equal, round, and reactive to light.  Neck:     Thyroid: No thyromegaly.  Cardiovascular:     Rate and Rhythm: Normal rate and regular rhythm.     Heart sounds: Normal heart sounds. No murmur heard. Pulmonary:     Effort: Pulmonary effort is normal. No respiratory distress.     Breath sounds: Normal breath sounds. No wheezing or rales.  Abdominal:     General: There is no distension.     Palpations: Abdomen is soft.     Tenderness: There is no abdominal tenderness.  Musculoskeletal:        General: No deformity.     Cervical back: Neck supple.     Right lower leg: No edema.     Left lower leg: No edema.  Lymphadenopathy:     Cervical: No cervical adenopathy.  Skin:    General: Skin is warm and dry.     Findings: No rash.  Neurological:     Mental Status: She is alert and oriented to person, place, and time. Mental status is at baseline.     Gait: Gait normal.  Psychiatric:        Mood and Affect: Mood normal.        Behavior: Behavior normal.        Thought Content: Thought content normal.      No results found for any visits on 08/09/23.  Assessment & Plan    Routine Health Maintenance and Physical Exam  Exercise Activities and Dietary recommendations  Goals      Patient Stated     Be more active        Immunization History  Administered Date(s) Administered   Fluad Quad(high Dose 65+) 02/15/2019, 02/18/2020, 02/18/2021, 01/28/2022   Influenza, High Dose Seasonal PF 01/13/2017, 02/14/2018, 12/28/2022   Influenza-Unspecified 01/18/2023   Moderna  Covid-19 Fall Seasonal Vaccine 41yrs & older 03/26/2022, 12/28/2022   PFIZER(Purple Top)SARS-COV-2 Vaccination 05/29/2019, 06/19/2019, 01/25/2020   Pfizer Covid-19 Vaccine Bivalent Booster 73yrs & up 09/19/2020, 02/26/2021   Pneumococcal Conjugate-13 06/07/2014   Pneumococcal Polysaccharide-23 06/23/2015   Respiratory Syncytial Virus Vaccine,Recomb Aduvanted(Arexvy) 03/26/2022   Tdap 03/17/2016   Zoster Recombinant(Shingrix) 04/28/2022    Health Maintenance  Topic Date Due   MAMMOGRAM  10/30/2016   Colonoscopy  09/04/2020   Zoster Vaccines- Shingrix (2 of 2) 06/23/2022   COVID-19 Vaccine (8 - 2024-25 season) 06/27/2023   DEXA SCAN  09/11/2023   INFLUENZA VACCINE  11/18/2023   Medicare Annual Wellness (AWV)  06/28/2024   DTaP/Tdap/Td (2 - Td or Tdap) 03/17/2026   Pneumonia Vaccine 53+ Years old  Completed   Hepatitis C Screening  Completed   HPV VACCINES  Aged Out   Meningococcal B Vaccine  Aged Out    Discussed health benefits of physical activity, and encouraged her to engage in regular exercise appropriate for her age and condition.  Problem List Items Addressed This Visit       Cardiovascular and Mediastinum   Hypertension   Blood pressure initially elevated at 168/69, improved to 138/86 upon recheck. Reports elevated readings with automated cuffs, prefers manual measurement. - Recheck blood pressure manually before leaving - Follow-up in 6 months for blood pressure management      Relevant Orders   Comprehensive metabolic panel with GFR     Musculoskeletal and Integument   Osteoporosis   Bone density test due soon, not yet scheduled. She is aware and will follow up on scheduling. - Ensure bone density test is scheduled        Other   Hyperlipidemia   Previously well controlled Continue statin Repeat FLP and CMP      Relevant Orders   Comprehensive metabolic panel with GFR   Lipid panel   Obesity (BMI 30-39.9)   Discussed importance of healthy weight  management Discussed diet and exercise       Fibromyalgia   Reports improvement iwht cymbalta   Continue       Other Visit Diagnoses       Encounter for annual physical exam    -  Primary   Relevant Orders   Hemoglobin A1c   Comprehensive metabolic panel with GFR   Lipid panel     Tear duct infection, bilateral         Colon cancer screening       Relevant Orders   Cologuard     Hyperglycemia       Relevant Orders   Hemoglobin A1c           Eye infection Recurrent eye infection with green discharge from tear ducts, previously treated with amoxicillin  and another antibiotic. Symptoms suggest bacterial infection, possibly with chronic sinus involvement. Doxycycline  prescribed as next line treatment. Discussed potential for yeast infection as a side effect of antibiotics. Advised to use Diflucan  if yeast infection occurs after completing doxycycline . - Prescribe doxycycline  - Advise use of Diflucan  if yeast infection occurs post-doxycycline   Wellness Visit Annual wellness visit conducted. Discussed general health maintenance, including vaccinations and screenings. Received both doses of shingles vaccine. Declined mammogram and colonoscopy but agreed to Cologuard (discussed pros and cons given family history, but cologuard is better than nothing). Tetanus shot up to date until 2027. Pneumonia shot completed. Flu shot to be administered in the fall. - Send Cologuard kit to home - Ensure bone density test is scheduled - Administer flu shot in the fall        Return in about 6 months (around 02/08/2024) for chronic disease f/u.     Aden Agreste, MD  [facility] [phone] (phone) [phone] (fax)  [facility]

## 2023-08-09 NOTE — Assessment & Plan Note (Signed)
 Bone density test due soon, not yet scheduled. She is aware and will follow up on scheduling. - Ensure bone density test is scheduled

## 2023-08-10 LAB — COMPREHENSIVE METABOLIC PANEL WITH GFR
ALT: 43 IU/L — ABNORMAL HIGH (ref 0–32)
AST: 37 IU/L (ref 0–40)
Albumin: 3.9 g/dL (ref 3.8–4.8)
Alkaline Phosphatase: 51 IU/L (ref 44–121)
BUN/Creatinine Ratio: 17 (ref 12–28)
BUN: 14 mg/dL (ref 8–27)
Bilirubin Total: 0.4 mg/dL (ref 0.0–1.2)
CO2: 25 mmol/L (ref 20–29)
Calcium: 9.4 mg/dL (ref 8.7–10.3)
Chloride: 104 mmol/L (ref 96–106)
Creatinine, Ser: 0.84 mg/dL (ref 0.57–1.00)
Globulin, Total: 2.5 g/dL (ref 1.5–4.5)
Glucose: 86 mg/dL (ref 70–99)
Potassium: 4 mmol/L (ref 3.5–5.2)
Sodium: 143 mmol/L (ref 134–144)
Total Protein: 6.4 g/dL (ref 6.0–8.5)
eGFR: 73 mL/min/{1.73_m2} (ref >59)

## 2023-08-10 LAB — LIPID PANEL
Chol/HDL Ratio: 4.6 ratio — ABNORMAL HIGH (ref 0.0–4.4)
Cholesterol, Total: 175 mg/dL (ref 100–199)
HDL: 38 mg/dL — ABNORMAL LOW (ref >39)
LDL Chol Calc (NIH): 115 mg/dL — ABNORMAL HIGH (ref 0–99)
Triglycerides: 124 mg/dL (ref 0–149)
VLDL Cholesterol Cal: 22 mg/dL (ref 5–40)

## 2023-08-10 LAB — HEMOGLOBIN A1C
Est. average glucose Bld gHb Est-mCnc: 123 mg/dL
Hgb A1c MFr Bld: 5.9 % — ABNORMAL HIGH (ref 4.8–5.6)

## 2023-08-11 ENCOUNTER — Encounter: Payer: Self-pay | Admitting: Family Medicine

## 2023-08-18 LAB — COLOGUARD

## 2023-09-02 ENCOUNTER — Ambulatory Visit: Payer: Self-pay | Admitting: Family Medicine

## 2023-09-02 LAB — COLOGUARD: COLOGUARD: NEGATIVE

## 2023-11-08 ENCOUNTER — Other Ambulatory Visit: Payer: Self-pay | Admitting: Family Medicine

## 2023-11-08 DIAGNOSIS — K219 Gastro-esophageal reflux disease without esophagitis: Secondary | ICD-10-CM

## 2024-04-16 ENCOUNTER — Encounter: Payer: Self-pay | Admitting: Family Medicine

## 2024-04-16 ENCOUNTER — Telehealth: Admitting: Family Medicine

## 2024-04-16 DIAGNOSIS — R296 Repeated falls: Secondary | ICD-10-CM | POA: Diagnosis not present

## 2024-04-16 DIAGNOSIS — R29898 Other symptoms and signs involving the musculoskeletal system: Secondary | ICD-10-CM

## 2024-04-16 NOTE — Progress Notes (Signed)
 "   MyChart Video Visit    Virtual Visit via Video Note   This format is felt to be most appropriate for this patient at this time. Physical exam was limited by quality of the video and audio technology used for the visit.    Patient location: home Provider location: [facility] Persons involved in the visit: patient, provider  I discussed the limitations of evaluation and management by telemedicine and the availability of in person appointments. The patient expressed understanding and agreed to proceed.  Patient: Kara French   DOB: 1950-07-26   73 y.o. Female  MRN: [id number] Visit Date: 04/16/2024  Today's healthcare provider: Jon Eva, MD   No chief complaint on file.  Subjective    HPI   Discussed the use of AI scribe software for clinical note transcription with the patient, who gave verbal consent to proceed.  History of Present Illness   Kara French is a 73 year old female who presents for a referral to physical therapy following a fall and subsequent mobility issues.  She fell in her kitchen at the end of September, with bruises and a sprained wrist. Since then she has had three significant stumbles, which she attributes to not lifting her feet well due to stiff hips, sore knees, and weak thighs.  She has been paying out of pocket for physical therapy at [facility], including dry needling of her hips and thighs that relieved muscle knots. She feels ready to transition to more traditional strengthening exercises and is requesting a PT referral for this ongoing mobility and fall risk concern.       Review of Systems      Objective    There were no vitals taken for this visit.      Physical Exam Constitutional:      General: She is not in acute distress.    Appearance: Normal appearance.  HENT:     Head: Normocephalic.  Pulmonary:     Effort: Pulmonary effort is normal. No respiratory distress.  Neurological:      Mental Status: She is alert and oriented to person, place, and time. Mental status is at baseline.        Assessment & Plan     Problem List Items Addressed This Visit   None Visit Diagnoses       Recurrent falls    -  Primary   Relevant Orders   Ambulatory referral to Physical Therapy     Weakness of both lower extremities       Relevant Orders   Ambulatory referral to Physical Therapy           Recurrent falls with lower extremity weakness Recurrent falls since September due to lower extremity weakness, including stiff hips, sore knees, and weak thighs. Previous physical therapy focused on dry needling for hip and thigh knots. Now ready for rehabilitation-focused physical therapy. - Referred to physical therapy at [facility] in [place] for rehabilitation-focused therapy. - Advised to contact the office if scheduling issues arise for potential same-day appointments.       No orders of the defined types were placed in this encounter.    Return in about 4 months (around 08/15/2024) for CPE.     I discussed the assessment and treatment plan with the patient. The patient was provided an opportunity to ask questions and all were answered. The patient agreed with the plan and demonstrated an understanding of the instructions.   The patient was advised  to call back or seek an in-person evaluation if the symptoms worsen or if the condition fails to improve as anticipated.   Jon Eva, MD [facility] [phone] (phone) [phone] (fax)  [facility]   "

## 2024-04-24 ENCOUNTER — Encounter: Payer: Self-pay | Admitting: Family Medicine

## 2024-04-24 NOTE — Telephone Encounter (Signed)
 Ok to resend referral to PT (from recent visit) to [facility]. Include dry needling in the comments. Let patient know after it is sent please. Thanks!

## 2024-04-25 ENCOUNTER — Other Ambulatory Visit: Payer: Self-pay

## 2024-04-25 DIAGNOSIS — R296 Repeated falls: Secondary | ICD-10-CM

## 2024-04-25 DIAGNOSIS — R29898 Other symptoms and signs involving the musculoskeletal system: Secondary | ICD-10-CM

## 2024-04-25 NOTE — Telephone Encounter (Signed)
"  New referral has been placed.   "

## 2024-05-02 ENCOUNTER — Ambulatory Visit: Attending: Family Medicine

## 2024-05-02 DIAGNOSIS — R262 Difficulty in walking, not elsewhere classified: Secondary | ICD-10-CM | POA: Insufficient documentation

## 2024-05-02 DIAGNOSIS — R29898 Other symptoms and signs involving the musculoskeletal system: Secondary | ICD-10-CM | POA: Diagnosis not present

## 2024-05-02 DIAGNOSIS — R2681 Unsteadiness on feet: Secondary | ICD-10-CM | POA: Diagnosis present

## 2024-05-02 DIAGNOSIS — R269 Unspecified abnormalities of gait and mobility: Secondary | ICD-10-CM | POA: Diagnosis present

## 2024-05-02 DIAGNOSIS — M6281 Muscle weakness (generalized): Secondary | ICD-10-CM | POA: Insufficient documentation

## 2024-05-02 DIAGNOSIS — R278 Other lack of coordination: Secondary | ICD-10-CM | POA: Insufficient documentation

## 2024-05-02 DIAGNOSIS — R296 Repeated falls: Secondary | ICD-10-CM | POA: Diagnosis present

## 2024-05-02 DIAGNOSIS — R2689 Other abnormalities of gait and mobility: Secondary | ICD-10-CM | POA: Diagnosis present

## 2024-05-02 NOTE — Therapy (Signed)
 " OUTPATIENT PHYSICAL THERAPY NEURO EVALUATION   Patient Name: Kara French MRN: [id number] DOB:February 26, 1951, 74 y.o., female Today's Date: 05/04/2024   PCP: Dr. Illene Eva  REFERRING PROVIDER: Dr. Jon Eva  END OF SESSION:    Past Medical History:  Diagnosis Date   Allergic rhinitis    Allergy    Anemia    Childhood   Anxiety    Arthritis    Barrett's esophagus 2017   biopsy confirmed, related to GERD   Cataract 2021   GERD (gastroesophageal reflux disease)    Heart murmur 1990   Positional murmur   Hyperlipidemia    Hypertension    Mitral valve prolapse    followed by cardiology   Osteoporosis 2022   Precancerous skin lesion    Past Surgical History:  Procedure Laterality Date   BARTHOLIN GLAND CYST EXCISION  1975   BREAST ENHANCEMENT SURGERY Bilateral 1975   COSMETIC SURGERY  1975   Breast augmentation   ESOPHAGOGASTRODUODENOSCOPY (EGD) WITH PROPOFOL  N/A 10/30/2019   Procedure: ESOPHAGOGASTRODUODENOSCOPY (EGD) WITH PROPOFOL ;  Surgeon: Jinny Carmine, MD;  Location: [facility] ENDOSCOPY;  Service: Endoscopy;  Laterality: N/A;   EYE SURGERY Left 02/28/2019   cataract surgery   EYE SURGERY Right 03/28/2019   cataract surgery   LAPAROSCOPIC CHOLECYSTECTOMY  2011   LAPAROSCOPIC OOPHERECTOMY Right 1987   2/2 benign tumor   TRANSANAL EXCISION OF RECTAL MASS WITH HEMORRHOID INJECTION  1989   hemorrhoid removal   TUBAL LIGATION  1987   Patient Active Problem List   Diagnosis Date Noted   Adhesive capsulitis of left shoulder 01/28/2022   Fibromyalgia 02/18/2020   Obesity (BMI 30-39.9) 08/16/2019   Extrinsic asthma 02/15/2019   Osteoporosis 02/15/2019   IBS (irritable bowel syndrome) 05/11/2017   Hypertension    Hyperlipidemia    Allergic rhinitis    GERD (gastroesophageal reflux disease)    Mitral valve prolapse     ONSET DATE: Sept 2025  REFERRING DIAG: Recurrent falls/LE muscle weakness  THERAPY DIAG:  Abnormality of gait and mobility -  Plan: PT plan of care cert/re-cert  Difficulty in walking, not elsewhere classified - Plan: PT plan of care cert/re-cert  Muscle weakness (generalized) - Plan: PT plan of care cert/re-cert  Other abnormalities of gait and mobility - Plan: PT plan of care cert/re-cert  Other lack of coordination - Plan: PT plan of care cert/re-cert  Repeated falls - Plan: PT plan of care cert/re-cert  Unsteadiness on feet - Plan: PT plan of care cert/re-cert  Rationale for Evaluation and Treatment: Rehabilitation  SUBJECTIVE:  SUBJECTIVE STATEMENT: Patient reports she has had some previous Physical therapy for right knee dysfunction, plantar fasciitis, and lower-extremity weakness. Chief Complaints of some reports right knee issues including weakness and instability. General lower-extremity weakness, possibly related to history of frozen shoulder and overall mobility decline. Patient reports history of Present Illness including a fall on September 25 and 3 additional hard stumbles since the fall. Currently no pain at rest, but notes ongoing concerns with stability and function.  Has previously received dry needling with some benefit. Reports a history of fibromyalgia w functional limitations difficulty with lower-extremity strength, stability, and balance.  Concerned about fall risk and maintaining mobility.     Pt accompanied by: self  PERTINENT HISTORY: Per office visit note on 04/16/2024- Recurrent falls with lower extremity weakness Recurrent falls since September due to lower extremity weakness, including stiff hips, sore knees, and weak thighs. Previous physical therapy focused on dry needling for hip and thigh knots. Now ready for rehabilitation-focused physical therapy. - Referred to physical therapy at [facility]  in [place] for rehabilitation-focused therapy. - Advised to contact the office if scheduling issues arise for potential same-day appointments.  PAIN:  Are you having pain? No  PRECAUTIONS: Fall  RED FLAGS: None   WEIGHT BEARING RESTRICTIONS: No  FALLS: Has patient fallen in last 6 months? Yes. Number of falls 1  LIVING ENVIRONMENT: Lives with: lives alone Lives in: Other [place] Stairs: Flat entry in front and 4 step in garage with no rail Has following equipment at home: Single point cane  PLOF: Independent  PATIENT GOALS: I want to be to walk better- at least 1-2 miles, climb stairs, not fall  OBJECTIVE:  Note: Objective measures were completed at Evaluation unless otherwise noted.  DIAGNOSTIC FINDINGS:   COGNITION: Overall cognitive status: Within functional limits for tasks assessed   SENSATION: WFL  COORDINATION: Intact heel to shin bilaterally   POSTURE: rounded shoulders and forward head  LOWER EXTREMITY ROM:     Active  Right Eval Left Eval  Hip flexion    Hip extension    Hip abduction    Hip adduction    Hip internal rotation    Hip external rotation    Knee flexion    Knee extension    Ankle dorsiflexion    Ankle plantarflexion    Ankle inversion    Ankle eversion     (Blank rows = not tested)  LOWER EXTREMITY MMT:    MMT Right Eval Left Eval  Hip flexion 4 4  Hip extension 4 4  Hip abduction 3+ 3+  Hip adduction 4+ 4+  Hip internal rotation 4- 4-  Hip external rotation 4- 4-  Knee flexion 4 4  Knee extension 4 4  Ankle dorsiflexion 4 4  Ankle plantarflexion    Ankle inversion    Ankle eversion    (Blank rows = not tested)  BED MOBILITY:  Not tested  TRANSFERS: Sit to stand: Complete Independence  Assistive device utilized: None     Stand to sit: Complete Independence  Assistive device utilized: None     Chair to chair: Complete Independence  Assistive device utilized: None       RAMP:  Not tested  CURB:  Not  tested  STAIRS: Not tested GAIT: Findings:  Patient ambulated with short reciprocal steps without LOB around 75 feet  FUNCTIONAL TESTS:  30 seconds chair stand test: 10 reps Timed up and go (TUG): 7.98 sec w/o AD 6 minute walk test: To be  assessed next visit 10 meter walk test: 10.32 sec and 10.24 BERG balance test: 54/56         PATIENT SURVEYS:  ABC scale: The Activities-Specific Balance Confidence (ABC) Scale 0% 10 20 30  40 50 60 70 80 90 100% No confidence<->completely confident  How confident are you that you will not lose your balance or become unsteady when you . . .   Date tested 05/02/2024  Walk around the house 100%  2. Walk up or down stairs 60%  3. Bend over and pick up a slipper from in front of a closet floor 90%  4. Reach for a small can off a shelf at eye level 100%  5. Stand on tip toes and reach for something above your head 40%  6. Stand on a chair and reach for something 0%  7. Sweep the floor 100%  8. Walk outside the house to a car parked in the driveway 100%  9. Get into or out of a car 100%  10. Walk across a parking lot to the mall 100%  11. Walk up or down a ramp 90%  12. Walk in a crowded mall where people rapidly walk past you 90%  13. Are bumped into by people as you walk through the mall 50%  14. Step onto or off of an escalator while you are holding onto the railing 50%  15. Step onto or off an escalator while holding onto parcels such that you cannot hold onto the railing 20%  16. Walk outside on icy sidewalks 0%  Total: #/16 68.1%                                                                                                                                 TREATMENT DATE: 05/02/2024   PT EVALUATION   PATIENT EDUCATION: Education details: Purpose of PT and plan of care Person educated: Patient Education method: Explanation Education comprehension: verbalized understanding  HOME EXERCISE PROGRAM: To be initiated next  visit  GOALS: Goals reviewed with patient? Yes  SHORT TERM GOALS: Target date: 06/13/2024  Pt will be independent with HEP in order to improve strength and balance in order to decrease fall risk and improve function at home and work.    Baseline: No Formal HEP in place   Goal Status: Initial  LONG TERM GOALS: Target date: 07/25/2024  1.  Patient will complete 14 or more reps in 30 sec STS test indicating an increased LE strength and improved balance. Baseline: EVAL- 10 reps Goal status: INITIAL  2.  Patient will improve ABC score by 9 points to demonstrate statistically significant improvement in mobility and quality of life as it relates to their confidence in her balance.  Baseline: EVAL: 68.1 % Goal status: INITIAL   3.  Patient will increase Minibest or FGA >2 points to demonstrate decreased fall risk during functional activities. Baseline: EVAL= To be assessed 2nd visit Goal status: INITIAL  4.  Patient will improve bilateral LE muscle hip abd strength by 1/2 grade with MMT for improved hip stability with standing and improved balance with mobility.  Baseline: EVAL- See Chart Goal status: INITIAL   5.   Patient will increase six minute walk test distance by 150 feet or more for progression to community ambulator and improve gait ability Baseline: EVAL- To be assessed visit #2 Goal status: INITIAL    ASSESSMENT:  CLINICAL IMPRESSION: Patient is a 74 y.o. female who was seen today for physical therapy evaluation and treatment for recurrent falls and LE muscle weakness. PT eval reveal deficits including lower extremity muscle weakness (more proximal than distal), dynamic balance impairments and decreased confidence in her balance. She will benefit from skilled PT services to improve her overall LE strength and functional mobility to decrease her risk of falling and restore confidence in her balance and improve her quality of function.   OBJECTIVE IMPAIRMENTS: decreased  balance, decreased coordination, difficulty walking, and decreased strength.   ACTIVITY LIMITATIONS: carrying, lifting, bending, standing, squatting, stairs, and transfers  PARTICIPATION LIMITATIONS: meal prep, cleaning, laundry, shopping, community activity, and yard work  PERSONAL FACTORS: 1-2 comorbidities: HTN and Fibromyalgia are also affecting patient's functional outcome.   REHAB POTENTIAL: Good  CLINICAL DECISION MAKING: Stable/uncomplicated  EVALUATION COMPLEXITY: Low  PLAN:  PT FREQUENCY: 1-2x/week  PT DURATION: 12 weeks  PLANNED INTERVENTIONS: 97164- PT Re-evaluation, 97750- Physical Performance Testing, 97110-Therapeutic exercises, 97530- Therapeutic activity, [id number]- Neuromuscular re-education, 97535- Self Care, [id number]- Manual therapy, [id number]- Gait training, [phone]- Orthotic Initial, [phone]- Orthotic/Prosthetic subsequent, [phone]- Canalith repositioning, [phone]- Electrical stimulation (manual), [phone] (1-2 muscles), [id number] (3+ muscles)- Dry Needling, Patient/Family education, Balance training, Stair training, Taping, Joint mobilization, Joint manipulation, Spinal manipulation, Spinal mobilization, Vestibular training, DME instructions, Cryotherapy, and Moist heat  PLAN FOR NEXT SESSION:  FGA next visit or MiniBest next visit Initiate balance activities and add to HEP as appropriate 6 min walk test   Reyes LOISE London, PT 05/04/2024, 8:33 AM        "

## 2024-05-08 ENCOUNTER — Ambulatory Visit

## 2024-05-08 DIAGNOSIS — R269 Unspecified abnormalities of gait and mobility: Secondary | ICD-10-CM | POA: Diagnosis not present

## 2024-05-08 DIAGNOSIS — R2689 Other abnormalities of gait and mobility: Secondary | ICD-10-CM

## 2024-05-08 DIAGNOSIS — R262 Difficulty in walking, not elsewhere classified: Secondary | ICD-10-CM

## 2024-05-08 DIAGNOSIS — R2681 Unsteadiness on feet: Secondary | ICD-10-CM

## 2024-05-08 DIAGNOSIS — R296 Repeated falls: Secondary | ICD-10-CM

## 2024-05-08 DIAGNOSIS — M6281 Muscle weakness (generalized): Secondary | ICD-10-CM

## 2024-05-08 DIAGNOSIS — R278 Other lack of coordination: Secondary | ICD-10-CM

## 2024-05-08 NOTE — Therapy (Signed)
 " OUTPATIENT PHYSICAL THERAPY NEURO TREATMENT   Patient Name: Kara French MRN: [id number] DOB:14-Oct-1950, 74 y.o., female Today's Date: 05/09/2024   PCP: Dr. Illene Eva  REFERRING PROVIDER: Dr. Jon Eva  END OF SESSION:  PT End of Session - 05/08/24 1619     Visit Number 2    Number of Visits 24    Date for Recertification  07/25/24    Authorization Time Period 05/02/2024-08/06/2024    Authorization - Visit Number 2    Authorization - Number of Visits 24    PT Start Time 1616    PT Stop Time 1653    PT Time Calculation (min) 37 min    Equipment Utilized During Treatment Gait belt    Activity Tolerance Patient tolerated treatment well    Behavior During Therapy WFL for tasks assessed/performed           Past Medical History:  Diagnosis Date   Allergic rhinitis    Allergy    Anemia    Childhood   Anxiety    Arthritis    Barrett's esophagus 2017   biopsy confirmed, related to GERD   Cataract 2021   GERD (gastroesophageal reflux disease)    Heart murmur 1990   Positional murmur   Hyperlipidemia    Hypertension    Mitral valve prolapse    followed by cardiology   Osteoporosis 2022   Precancerous skin lesion    Past Surgical History:  Procedure Laterality Date   BARTHOLIN GLAND CYST EXCISION  1975   BREAST ENHANCEMENT SURGERY Bilateral 1975   COSMETIC SURGERY  1975   Breast augmentation   ESOPHAGOGASTRODUODENOSCOPY (EGD) WITH PROPOFOL  N/A 10/30/2019   Procedure: ESOPHAGOGASTRODUODENOSCOPY (EGD) WITH PROPOFOL ;  Surgeon: Jinny Carmine, MD;  Location: [facility] ENDOSCOPY;  Service: Endoscopy;  Laterality: N/A;   EYE SURGERY Left 02/28/2019   cataract surgery   EYE SURGERY Right 03/28/2019   cataract surgery   LAPAROSCOPIC CHOLECYSTECTOMY  2011   LAPAROSCOPIC OOPHERECTOMY Right 1987   2/2 benign tumor   TRANSANAL EXCISION OF RECTAL MASS WITH HEMORRHOID INJECTION  1989   hemorrhoid removal   TUBAL LIGATION  1987   Patient Active Problem List    Diagnosis Date Noted   Adhesive capsulitis of left shoulder 01/28/2022   Fibromyalgia 02/18/2020   Obesity (BMI 30-39.9) 08/16/2019   Extrinsic asthma 02/15/2019   Osteoporosis 02/15/2019   IBS (irritable bowel syndrome) 05/11/2017   Hypertension    Hyperlipidemia    Allergic rhinitis    GERD (gastroesophageal reflux disease)    Mitral valve prolapse     ONSET DATE: Sept 2025  REFERRING DIAG: Recurrent falls/LE muscle weakness  THERAPY DIAG:  Abnormality of gait and mobility  Difficulty in walking, not elsewhere classified  Muscle weakness (generalized)  Other abnormalities of gait and mobility  Other lack of coordination  Repeated falls  Unsteadiness on feet  Rationale for Evaluation and Treatment: Rehabilitation  SUBJECTIVE:  SUBJECTIVE STATEMENT: From Today:  Patient reports doing okay- states not really hurting today but definitely having some imbalance.   From EVAL:  Patient reports she has had some previous Physical therapy for right knee dysfunction, plantar fasciitis, and lower-extremity weakness. Chief Complaints of some reports right knee issues including weakness and instability. General lower-extremity weakness, possibly related to history of frozen shoulder and overall mobility decline. Patient reports history of Present Illness including a fall on September 25 and 3 additional hard stumbles since the fall. Currently no pain at rest, but notes ongoing concerns with stability and function.  Has previously received dry needling with some benefit. Reports a history of fibromyalgia w functional limitations difficulty with lower-extremity strength, stability, and balance.  Concerned about fall risk and maintaining mobility.     Pt accompanied by: self  PERTINENT HISTORY: Per  office visit note on 04/16/2024- Recurrent falls with lower extremity weakness Recurrent falls since September due to lower extremity weakness, including stiff hips, sore knees, and weak thighs. Previous physical therapy focused on dry needling for hip and thigh knots. Now ready for rehabilitation-focused physical therapy. - Referred to physical therapy at [facility] in [place] for rehabilitation-focused therapy. - Advised to contact the office if scheduling issues arise for potential same-day appointments.  PAIN:  Are you having pain? No  PRECAUTIONS: Fall  RED FLAGS: None   WEIGHT BEARING RESTRICTIONS: No  FALLS: Has patient fallen in last 6 months? Yes. Number of falls 1  LIVING ENVIRONMENT: Lives with: lives alone Lives in: Other [place] Stairs: Flat entry in front and 4 step in garage with no rail Has following equipment at home: Single point cane  PLOF: Independent  PATIENT GOALS: I want to be to walk better- at least 1-2 miles, climb stairs, not fall  OBJECTIVE:  Note: Objective measures were completed at Evaluation unless otherwise noted.  DIAGNOSTIC FINDINGS:   COGNITION: Overall cognitive status: Within functional limits for tasks assessed   SENSATION: WFL  COORDINATION: Intact heel to shin bilaterally   POSTURE: rounded shoulders and forward head  LOWER EXTREMITY ROM:     Active  Right Eval Left Eval  Hip flexion    Hip extension    Hip abduction    Hip adduction    Hip internal rotation    Hip external rotation    Knee flexion    Knee extension    Ankle dorsiflexion    Ankle plantarflexion    Ankle inversion    Ankle eversion     (Blank rows = not tested)  LOWER EXTREMITY MMT:    MMT Right Eval Left Eval  Hip flexion 4 4  Hip extension 4 4  Hip abduction 3+ 3+  Hip adduction 4+ 4+  Hip internal rotation 4- 4-  Hip external rotation 4- 4-  Knee flexion 4 4  Knee extension 4 4  Ankle dorsiflexion 4 4  Ankle plantarflexion     Ankle inversion    Ankle eversion    (Blank rows = not tested)  BED MOBILITY:  Not tested  TRANSFERS: Sit to stand: Complete Independence  Assistive device utilized: None     Stand to sit: Complete Independence  Assistive device utilized: None     Chair to chair: Complete Independence  Assistive device utilized: None       RAMP:  Not tested  CURB:  Not tested  STAIRS: Not tested GAIT: Findings:  Patient ambulated with short reciprocal steps without LOB around 75 feet  FUNCTIONAL TESTS:  30 seconds chair stand test: 10 reps Timed up and go (TUG): 7.98 sec w/o AD 6 minute walk test: To be assessed next visit 10 meter walk test: 10.32 sec and 10.24 BERG balance test: 54/56    [facility] PT Assessment - 05/08/24 1649       Functional Gait  Assessment   Gait assessed  Yes    Gait Level Surface Walks 20 ft in less than 5.5 sec, no assistive devices, good speed, no evidence for imbalance, normal gait pattern, deviates no more than 6 in outside of the 12 in walkway width.    Change in Gait Speed Able to smoothly change walking speed without loss of balance or gait deviation. Deviate no more than 6 in outside of the 12 in walkway width.    Gait with Horizontal Head Turns Performs head turns smoothly with slight change in gait velocity (eg, minor disruption to smooth gait path), deviates 6-10 in outside 12 in walkway width, or uses an assistive device.    Gait with Vertical Head Turns Performs head turns with no change in gait. Deviates no more than 6 in outside 12 in walkway width.    Gait and Pivot Turn Pivot turns safely within 3 sec and stops quickly with no loss of balance.    Step Over Obstacle Is able to step over one shoe box (4.5 in total height) without changing gait speed. No evidence of imbalance.    Gait with Narrow Base of Support Is able to ambulate for 10 steps heel to toe with no staggering.    Gait with Eyes Closed Walks 20 ft, slow speed, abnormal gait pattern,  evidence for imbalance, deviates 10-15 in outside 12 in walkway width. Requires more than 9 sec to ambulate 20 ft.    Ambulating Backwards Walks 20 ft, no assistive devices, good speed, no evidence for imbalance, normal gait    Steps Alternating feet, must use rail.    Total Score 25              PATIENT SURVEYS:  ABC scale: The Activities-Specific Balance Confidence (ABC) Scale 0% 10 20 30  40 50 60 70 80 90 100% No confidence<->completely confident  How confident are you that you will not lose your balance or become unsteady when you . . .   Date tested 05/02/2024  Walk around the house 100%  2. Walk up or down stairs 60%  3. Bend over and pick up a slipper from in front of a closet floor 90%  4. Reach for a small can off a shelf at eye level 100%  5. Stand on tip toes and reach for something above your head 40%  6. Stand on a chair and reach for something 0%  7. Sweep the floor 100%  8. Walk outside the house to a car parked in the driveway 100%  9. Get into or out of a car 100%  10. Walk across a parking lot to the mall 100%  11. Walk up or down a ramp 90%  12. Walk in a crowded mall where people rapidly walk past you 90%  13. Are bumped into by people as you walk through the mall 50%  14. Step onto or off of an escalator while you are holding onto the railing 50%  15. Step onto or off an escalator while holding onto parcels such that you cannot hold onto the railing 20%  16. Walk outside on icy sidewalks 0%  Total: #/16 68.1%  TREATMENT DATE: 05/08/2024   6 Min Walk Test:  Instructed patient to ambulate as quickly and as safely as possible for 6 minutes using LRAD. Patient was allowed to take standing rest breaks without stopping the test, but if the patient required a sitting rest break the clock would be stopped and the test would be over.   Results: 1045 feet (319 meters, Avg speed 0.4m/s) using no AD with CGA . Results indicate that the patient has reduced endurance with ambulation compared to age matched norms.  Age Matched Norms: 26-69 yo M: 89 F: 26, 18-79 yo M: 39 F: 471, 60-89 yo M: 417 F: 392 MDC: 58.21 meters (190.98 feet) or 50 meters (ANPTA Core Set of Outcome Measures for Adults with Neurologic Conditions, 2018)    [facility] PT Assessment - 05/08/24 1649       Functional Gait  Assessment   Gait assessed  Yes    Gait Level Surface Walks 20 ft in less than 5.5 sec, no assistive devices, good speed, no evidence for imbalance, normal gait pattern, deviates no more than 6 in outside of the 12 in walkway width.    Change in Gait Speed Able to smoothly change walking speed without loss of balance or gait deviation. Deviate no more than 6 in outside of the 12 in walkway width.    Gait with Horizontal Head Turns Performs head turns smoothly with slight change in gait velocity (eg, minor disruption to smooth gait path), deviates 6-10 in outside 12 in walkway width, or uses an assistive device.    Gait with Vertical Head Turns Performs head turns with no change in gait. Deviates no more than 6 in outside 12 in walkway width.    Gait and Pivot Turn Pivot turns safely within 3 sec and stops quickly with no loss of balance.    Step Over Obstacle Is able to step over one shoe box (4.5 in total height) without changing gait speed. No evidence of imbalance.    Gait with Narrow Base of Support Is able to ambulate for 10 steps heel to toe with no staggering.    Gait with Eyes Closed Walks 20 ft, slow speed, abnormal gait pattern, evidence for imbalance, deviates 10-15 in outside 12 in walkway width. Requires more than 9 sec to ambulate 20 ft.    Ambulating Backwards Walks 20 ft, no assistive devices, good speed, no evidence for imbalance, normal gait    Steps Alternating feet, must use rail.    Total Score 25              PATIENT  EDUCATION: Education details: Purpose of PT and plan of care Person educated: Patient Education method: Explanation Education comprehension: verbalized understanding  HOME EXERCISE PROGRAM: To be initiated next visit  GOALS: Goals reviewed with patient? Yes  SHORT TERM GOALS: Target date: 06/13/2024  Pt will be independent with HEP in order to improve strength and balance in order to decrease fall risk and improve function at home and work.    Baseline: No Formal HEP in place   Goal Status: Initial  LONG TERM GOALS: Target date: 07/25/2024  1.  Patient will complete 14 or more reps in 30 sec STS test indicating an increased LE strength and improved balance. Baseline: EVAL- 10 reps Goal status: INITIAL  2.  Patient will improve ABC score by 9 points to demonstrate statistically significant improvement in mobility and quality of life as it relates to their confidence in her balance.  Baseline:  EVAL: 68.1 % Goal status: INITIAL   3.  Patient will increase Minibest or FGA >2 points to demonstrate decreased fall risk during functional activities. Baseline: EVAL= To be assessed 2nd visit; 1/20- 25/30 on FGA Goal status: INITIAL   4.  Patient will improve bilateral LE muscle hip abd strength by 1/2 grade with MMT for improved hip stability with standing and improved balance with mobility.  Baseline: EVAL- See Chart Goal status: INITIAL   5.   Patient will increase six minute walk test distance by 150 feet or more for progression to community ambulator and improve gait ability Baseline: EVAL- To be assessed visit #2; 1/20= 1045 Goal status: INITIAL    ASSESSMENT:  CLINICAL IMPRESSION: The patient demonstrates overall safe and functional gait and dynamic balance with a Functional Gait Assessment score of 25, indicating a low fall risk.  Mild gait instability is noted with horizontal head turns, evidenced by slight gait deviation and reduced velocity. Marked balance impairment is  observed during ambulation with eyes closed, characterized by slowed speed, abnormal gait pattern, and increased lateral deviation, suggesting increased reliance on visual input for postural control. The 6 Minute Walk Test further demonstrates reduced functional endurance and walking capacity-which is below age matched normative values. These findings indicate impaired aerobic endurance and decreased community ambulation capacity. Overall, the presentation is consistent with mild dynamic balance deficits under sensory challenged conditions and reduced gait endurance, which may limit prolonged and higher level mobility tasks despite generally intact basic functional gait. She will benefit from skilled PT services to improve her overall LE strength and functional mobility to decrease her risk of falling and restore confidence in her balance and improve her quality of function.   OBJECTIVE IMPAIRMENTS: decreased balance, decreased coordination, difficulty walking, and decreased strength.   ACTIVITY LIMITATIONS: carrying, lifting, bending, standing, squatting, stairs, and transfers  PARTICIPATION LIMITATIONS: meal prep, cleaning, laundry, shopping, community activity, and yard work  PERSONAL FACTORS: 1-2 comorbidities: HTN and Fibromyalgia are also affecting patient's functional outcome.   REHAB POTENTIAL: Good  CLINICAL DECISION MAKING: Stable/uncomplicated  EVALUATION COMPLEXITY: Low  PLAN:  PT FREQUENCY: 1-2x/week  PT DURATION: 12 weeks  PLANNED INTERVENTIONS: 97164- PT Re-evaluation, 97750- Physical Performance Testing, 97110-Therapeutic exercises, 97530- Therapeutic activity, [id number]- Neuromuscular re-education, 97535- Self Care, [id number]- Manual therapy, [id number]- Gait training, [phone]- Orthotic Initial, [phone]- Orthotic/Prosthetic subsequent, [phone]- Canalith repositioning, [phone]- Electrical stimulation (manual), [phone] (1-2 muscles), [id number] (3+ muscles)- Dry Needling, Patient/Family education, Balance  training, Stair training, Taping, Joint mobilization, Joint manipulation, Spinal manipulation, Spinal mobilization, Vestibular training, DME instructions, Cryotherapy, and Moist heat  PLAN FOR NEXT SESSION:   Initiate balance activities and add to HEP as appropriate Progressive endurance training to improve walking tolerance and cardiovascular capacity, including graded distance and speed based ambulation and functional task integration.  Balance training should emphasize sensory integration and reduction of visual dependence through activities that challenge proprioceptive and vestibular systems, such as gait tasks with reduced visual input, head movements, and varied surfaces, while maintaining safety.    Reyes LOISE London, PT 05/09/2024, 11:01 AM        "

## 2024-05-10 ENCOUNTER — Ambulatory Visit

## 2024-05-16 ENCOUNTER — Ambulatory Visit

## 2024-05-22 ENCOUNTER — Ambulatory Visit: Admitting: Physical Therapy

## 2024-05-22 DIAGNOSIS — R2689 Other abnormalities of gait and mobility: Secondary | ICD-10-CM

## 2024-05-22 DIAGNOSIS — R262 Difficulty in walking, not elsewhere classified: Secondary | ICD-10-CM

## 2024-05-22 DIAGNOSIS — R2681 Unsteadiness on feet: Secondary | ICD-10-CM

## 2024-05-22 DIAGNOSIS — R269 Unspecified abnormalities of gait and mobility: Secondary | ICD-10-CM

## 2024-05-22 DIAGNOSIS — R296 Repeated falls: Secondary | ICD-10-CM

## 2024-05-22 DIAGNOSIS — M6281 Muscle weakness (generalized): Secondary | ICD-10-CM

## 2024-05-22 DIAGNOSIS — R278 Other lack of coordination: Secondary | ICD-10-CM

## 2024-05-25 ENCOUNTER — Ambulatory Visit

## 2024-05-29 ENCOUNTER — Ambulatory Visit

## 2024-06-01 ENCOUNTER — Ambulatory Visit

## 2024-06-05 ENCOUNTER — Ambulatory Visit: Admitting: Physical Therapy

## 2024-06-08 ENCOUNTER — Ambulatory Visit

## 2024-06-12 ENCOUNTER — Ambulatory Visit: Admitting: Physical Therapy

## 2024-06-14 ENCOUNTER — Ambulatory Visit: Admitting: Physical Therapy

## 2024-06-20 ENCOUNTER — Ambulatory Visit

## 2024-06-25 ENCOUNTER — Ambulatory Visit

## 2024-06-28 ENCOUNTER — Ambulatory Visit: Admitting: Physical Therapy

## 2024-07-02 ENCOUNTER — Ambulatory Visit

## 2024-07-04 ENCOUNTER — Ambulatory Visit

## 2024-07-05 ENCOUNTER — Ambulatory Visit: Admitting: Physical Therapy

## 2024-07-09 ENCOUNTER — Ambulatory Visit

## 2024-07-11 ENCOUNTER — Ambulatory Visit

## 2024-07-16 ENCOUNTER — Ambulatory Visit

## 2024-07-18 ENCOUNTER — Ambulatory Visit

## 2024-07-23 ENCOUNTER — Ambulatory Visit

## 2024-07-25 ENCOUNTER — Ambulatory Visit

## 2024-07-30 ENCOUNTER — Ambulatory Visit

## 2024-08-01 ENCOUNTER — Ambulatory Visit

## 2024-08-06 ENCOUNTER — Ambulatory Visit

## 2024-08-08 ENCOUNTER — Ambulatory Visit

## 2024-08-09 ENCOUNTER — Encounter: Admitting: Family Medicine

## 2024-08-13 ENCOUNTER — Ambulatory Visit

## 2024-08-15 ENCOUNTER — Ambulatory Visit

## 2024-08-20 ENCOUNTER — Ambulatory Visit

## 2024-08-22 ENCOUNTER — Ambulatory Visit

## 2024-08-27 ENCOUNTER — Ambulatory Visit

## 2024-08-29 ENCOUNTER — Ambulatory Visit
